# Patient Record
Sex: Male | Born: 1995
Health system: Southern US, Community
[De-identification: ages and names within clinical notes are randomized; demographics above are authoritative.]

## PROBLEM LIST (undated history)

## (undated) HISTORY — PX: HERNIA REPAIR: SHX51

---
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
## 2016-03-10 ENCOUNTER — Emergency Department
Admission: EM | Admit: 2016-03-10 | Discharge: 2016-03-10 | Disposition: A | Discharge status: Home / Self Care | Payer: Self-pay | Source: Home / Self Care

## 2016-03-10 NOTE — ED Notes (Signed)
Pt here for PPD reading. Right forearm negative read, no induration.

## 2017-09-10 ENCOUNTER — Encounter: Payer: Self-pay | Admitting: Internal Medicine

## 2017-09-10 ENCOUNTER — Ambulatory Visit: Payer: 59 | Admitting: Internal Medicine

## 2017-09-10 VITALS — BP 120/74 | HR 77 | Temp 98.1°F | Ht 69.0 in | Wt 145.0 lb

## 2017-09-10 DIAGNOSIS — K644 Residual hemorrhoidal skin tags: Secondary | ICD-10-CM | POA: Diagnosis not present

## 2017-09-10 DIAGNOSIS — K5900 Constipation, unspecified: Secondary | ICD-10-CM | POA: Diagnosis not present

## 2017-09-10 MED ORDER — HYDROCORTISONE 2.5 % RE CREA: 1.0000 "application " | TOPICAL_CREAM | Freq: Two times a day (BID) | RECTAL | 0 refills | Status: DC

## 2017-09-10 NOTE — Patient Instructions (Signed)

## 2017-09-10 NOTE — Progress Notes (Signed)
HPI  Pt presents to the clinic today to establish care. He is transferring care from [facility].  He is concerned about constipation. He reports he has been struggling with this for a few years. He typically has a BM every other day. Occasionally, he has to take Mirilax to help with a BM. He reports he has recently gotten an external hemorrhoid. It was a little painful but no itching. He has noticed a very small amount of bright red blood when he wipes. He has tried Preperation H with minimal relief.  Flu: never Tdap: 01/2008 Dentist: as needed  No past medical history on file.  No current outpatient medications on file.   No current facility-administered medications for this visit.     No Known Allergies  Family History  Problem Relation Age of Onset  . Hyperlipidemia Mother   . Hyperlipidemia Father   . Hypertension Father   . Cancer Maternal Grandmother   . Hearing loss Maternal Grandmother   . Diabetes Maternal Grandfather   . Heart disease Maternal Grandfather   . Ovarian cancer Paternal Grandmother   . Hyperlipidemia Paternal Grandmother   . Hypertension Paternal Grandmother   . Depression Paternal Grandmother   . Arthritis Paternal Grandfather   . Prostate cancer Paternal Grandfather   . COPD Paternal Grandfather   . Heart disease Paternal Grandfather   . Hyperlipidemia Paternal Grandfather   . Hypertension Paternal Grandfather   . Stroke Paternal Grandfather     Social History   Socioeconomic History  . Marital status: Single    Spouse name: Not on file  . Number of children: Not on file  . Years of education: Not on file  . Highest education level: Not on file  Social Needs  . Financial resource strain: Not on file  . Food insecurity - worry: Not on file  . Food insecurity - inability: Not on file  . Transportation needs - medical: Not on file  . Transportation needs - non-medical: Not on file  Occupational History  . Not on file  Tobacco Use  .  Smoking status: Never Smoker  . Smokeless tobacco: Never Used  Substance and Sexual Activity  . Alcohol use: No  . Drug use: No  . Sexual activity: Not on file  Other Topics Concern  . Not on file  Social History Narrative  . Not on file    ROS:  Constitutional: Denies fever, malaise, fatigue, headache or abrupt weight changes.  HEENT: Denies eye pain, eye redness, ear pain, ringing in the ears, wax buildup, runny nose, nasal congestion, bloody nose, or sore throat. Respiratory: Denies difficulty breathing, shortness of breath, cough or sputum production.   Cardiovascular: Denies chest pain, chest tightness, palpitations or swelling in the hands or feet.  Gastrointestinal: Pt reports constipation and external hemorrhoid. Denies abdominal pain, bloating, diarrhea or blood in the stool.  GU: Denies frequency, urgency, pain with urination, blood in urine, odor or discharge. Musculoskeletal: Denies decrease in range of motion, difficulty with gait, muscle pain or joint pain and swelling.  Skin: Denies redness, rashes, lesions or ulcercations.  Neurological: Denies dizziness, difficulty with memory, difficulty with speech or problems with balance and coordination.  Psych: Denies anxiety, depression, SI/HI.  No other specific complaints in a complete review of systems (except as listed in HPI above).  PE:  BP 120/74   Pulse 77   Temp 98.1 F (36.7 C) (Oral)   Ht 5\' 9"  (1.753 m)   Wt 145 lb (65.8  kg)   SpO2 98%   BMI 21.41 kg/m  Wt Readings from Last 3 Encounters:  09/10/17 145 lb (65.8 kg)  05/05/15 130 lb (59 kg) (16 %, Z= -0.98)*   * Growth percentiles are based on CDC (Boys, 2-20 Years) data.    General: Appears his stated age, well developed, well nourished in NAD. Cardiovascular: Normal rate and rhythm. S1,S2 noted.  No murmur, rubs or gallops noted.  Pulmonary/Chest: Normal effort and positive vesicular breath sounds. No respiratory distress. No wheezes, rales or ronchi  noted.  Abdomen: Soft and nontender. Normal bowel sounds. No distention or masses noted.  Rectal: Patient refused. Neurological: Alert and oriented.  Psychiatric: Mood and affect normal. Behavior is normal. Judgment and thought content normal.    BMET    Component Value Date/Time   NA 139 05/05/2015 1750   K 4.2 05/05/2015 1750   CL 102 05/05/2015 1750   CO2 28 05/05/2015 1750   GLUCOSE 87 05/05/2015 1750   BUN 14 05/05/2015 1750   CREATININE 1.03 05/05/2015 1750   CALCIUM 9.4 05/05/2015 1750   GFRNONAA >60 05/05/2015 1750   GFRAA >60 05/05/2015 1750    Lipid Panel  No results found for: CHOL, TRIG, HDL, CHOLHDL, VLDL, LDLCALC  CBC    Component Value Date/Time   WBC 9.2 05/05/2015 1750   RBC 4.81 05/05/2015 1750   HGB 13.2 05/05/2015 1750   HCT 40.6 05/05/2015 1750   PLT 277 05/05/2015 1750   MCV 84.4 05/05/2015 1750   MCH 27.4 05/05/2015 1750   MCHC 32.5 05/05/2015 1750   RDW 13.4 05/05/2015 1750   LYMPHSABS 2.0 05/05/2015 1750   MONOABS 0.9 05/05/2015 1750   EOSABS 0.3 05/05/2015 1750   BASOSABS 0.1 05/05/2015 1750    Hgb A1C No results found for: HGBA1C   Assessment and Plan:  Constipation:  Discussed high fiber diet Encouraged him to make sure that he is drinking at least 48 ounces of water daily Ok to take Mirilax daily if needed  External Hemorrhoid:  eRx for Proctosol BID until resolved Avoid straining  Return precautions discussed Nicki ReaperBAITY, REGINA, NP

## 2017-10-08 ENCOUNTER — Ambulatory Visit: Payer: Self-pay | Admitting: *Deleted

## 2017-10-08 ENCOUNTER — Ambulatory Visit: Payer: 59 | Admitting: Physician Assistant

## 2017-10-08 ENCOUNTER — Encounter: Payer: Self-pay | Admitting: Physician Assistant

## 2017-10-08 ENCOUNTER — Other Ambulatory Visit: Payer: Self-pay

## 2017-10-08 VITALS — BP 140/70 | HR 88 | Temp 98.6°F | Resp 14 | Ht 69.0 in | Wt 151.0 lb

## 2017-10-08 DIAGNOSIS — S0993XA Unspecified injury of face, initial encounter: Secondary | ICD-10-CM

## 2017-10-08 DIAGNOSIS — S060X0A Concussion without loss of consciousness, initial encounter: Secondary | ICD-10-CM | POA: Diagnosis not present

## 2017-10-08 NOTE — Progress Notes (Signed)
Patient presents to clinic today c/o facial injury and potential concussion occurring early yesterday afternoon.  Patient endorses snowboarding when he lost his balance and fell face first into the ground. Was wearing both a helmet and snow goggles at the time. Noted bruising on the bridge of his nose with tenderness. [facility]. Felt foggy for a few minutes after impact but was then feeling much better except for the tenderness and headache. Patient denies change in vision or hearing. Some mild mental fogginess today but is feeling better. Denies any fever, chills, nausea or vomiting. Is hydrating well. Has note some mild anorexia.  History reviewed. No pertinent past medical history.  No current outpatient medications on file prior to visit.   No current facility-administered medications on file prior to visit.     No Known Allergies  Family History  Problem Relation Age of Onset  . Hyperlipidemia Mother   . Hyperlipidemia Father   . Hypertension Father   . Cancer Maternal Grandmother   . Hearing loss Maternal Grandmother   . Diabetes Maternal Grandfather   . Heart disease Maternal Grandfather   . Ovarian cancer Paternal Grandmother   . Hyperlipidemia Paternal Grandmother   . Hypertension Paternal Grandmother   . Depression Paternal Grandmother   . Arthritis Paternal Grandfather   . Prostate cancer Paternal Grandfather   . COPD Paternal Grandfather   . Heart disease Paternal Grandfather   . Hyperlipidemia Paternal Grandfather   . Hypertension Paternal Grandfather   . Stroke Paternal Grandfather     Social History   Socioeconomic History  . Marital status: Single    Spouse name: None  . Number of children: None  . Years of education: None  . Highest education level: None  Social Needs  . Financial resource strain: None  . Food insecurity - worry: None  . Food insecurity - inability: None  . Transportation needs  - medical: None  . Transportation needs - non-medical: None  Occupational History  . None  Tobacco Use  . Smoking status: Never Smoker  . Smokeless tobacco: Never Used  Substance and Sexual Activity  . Alcohol use: No  . Drug use: No  . Sexual activity: None  Other Topics Concern  . None  Social History Narrative  . None   Review of Systems - See HPI.  All other ROS are negative.  BP 140/70   Pulse 88   Temp 98.6 F (37 C) (Oral)   Resp 14   Ht 5\' 9"  (1.753 m)   Wt 151 lb (68.5 kg)   SpO2 98%   BMI 22.30 kg/m   Physical Exam  Constitutional: He is oriented to person, place, and time and well-developed, well-nourished, and in no distress.  HENT:  Head: Normocephalic.  Right Ear: Tympanic membrane and external ear normal.  Left Ear: Tympanic membrane and external ear normal.  Nose: Sinus tenderness present. No nasal deformity. No epistaxis. Right sinus exhibits no maxillary sinus tenderness and no frontal sinus tenderness. Left sinus exhibits no maxillary sinus tenderness and no frontal sinus tenderness.  Mouth/Throat: Uvula is midline, oropharynx is clear and moist and mucous membranes are normal. No oropharyngeal exudate.    Eyes: Right eye visual fields normal and left eye visual fields normal. Conjunctivae and EOM are normal. Pupils are equal, round, and reactive to light.  Neck: Neck supple.  Cardiovascular: Normal rate, regular rhythm, normal heart sounds and intact distal pulses.  Pulmonary/Chest:  Effort normal and breath sounds normal. No respiratory distress. He has no wheezes. He has no rales. He exhibits no tenderness.  Neurological: He is alert and oriented to person, place, and time. He has intact cranial nerves. He displays facial symmetry. No cranial nerve deficit. Gait normal. Coordination normal.  Skin: Skin is warm and dry. No rash noted.  Vitals reviewed.  Assessment/Plan: 1. Concussion without loss of consciousness, initial encounter Mild concussion.  Neuro examination within normal limits. Mild headache today. Discussed supportive measures and OTC medications. Reassurance given.   2. Facial trauma, initial encounter Mild nasal contusion. Cannot r/o small anterior fracture but no bony deformity noted. Declines imaging today. OTC medications reviewed.   Strict return precautions reviewed. Alarm signs/symptoms reviewed with patient that would warrant ER assessment.   Piedad Climes, PA-C

## 2017-10-08 NOTE — Patient Instructions (Signed)
Please stay well-hydrated and get plenty of rest.  Keep a well-balanced diet.  Tylenol or Ibuprofen if needed for headache.  It is common to get a headache for up to 3-5 days after a concussion. It is very fortunate that you were wearing a helmet and goggles or this would have been much worse.   If you note any nausea or vomiting, any change in mentation or acute worsening of headache, please be seen ASAP at ER. I do not expect this but want to review with you.   Take it easy for a few days.    Concussion, Adult A concussion is a brain injury from a direct hit (blow) to the head or body. This injury causes the brain to shake quickly back and forth inside the skull. It is caused by:  A hit to the head.  A quick and sudden movement (jolt) of the head or neck.  How fast you will get better from a concussion depends on many things like how bad your concussion was, what part of your brain was hurt, how old you are, and how healthy you were before the concussion. Recovery can take time. It is important to wait to return to activity until a doctor says it is safe and your symptoms are all gone. Follow these instructions at home: Activity  Limit activities that need a lot of thought or concentration. These include: ? Homework or work for your job. ? Watching TV. ? Computer work. ? Playing memory games and puzzles.  Rest. Rest helps the brain to heal. Make sure you: ? Get plenty of sleep at night. Do not stay up late. ? Go to bed at the same time every day. ? Rest during the day. Take naps or rest breaks when you feel tired.  It can be dangerous if you get another concussion before the first one has healed Do not do activities that could cause a second concussion, such as riding a bike or playing sports.  Ask your doctor when you can return to your normal activities, like driving, riding a bike, or using machinery. Your ability to react may be slower. Do not do these activities if you are  dizzy. Your doctor will likely give you a plan for slowly going back to activities. General instructions  Take over-the-counter and prescription medicines only as told by your doctor.  Do not drink alcohol until your doctor says you can.  If it is harder than usual to remember things, write them down.  If you are easily distracted, try to do one thing at a time. For example, do not try to watch TV while making dinner.  Talk with family members or close friends when you need to make important decisions.  Watch your symptoms and tell other people to do the same. Other problems (complications) can happen after a concussion. Older adults with a brain injury may have a higher risk of serious problems, such as a blood clot in the brain.  Tell your teachers, school nurse, school counselor, coach, Event organiser, or work Production designer, theatre/television/film about your injury and symptoms. Tell them about what you can or cannot do. They should watch for: ? More problems with attention or concentration. ? More trouble remembering or learning new information. ? More time needed to do tasks or assignments. ? Being more annoyed (irritable) or having a harder time dealing with stress. ? Any other symptoms that get worse.  Keep all follow-up visits as told by your health care provider.  This is important. Prevention  It is very important that you donot get another brain injury, especially before you have healed. In rare cases, another injury can cause permanent brain damage, brain swelling, or death. You have the most risk if you get another head injury in the first 7-10 days after you were hurt before. To avoid injuries: ? Wear a seat belt when you ride in a car. ? Do not drink too much alcohol. ? Avoid activities that could make you get a second concussion, like contact sports. ? Wear a helmet when you do activities like:  Biking.  Skiing.  Skateboarding.  Skating. ? Make your home safe by:  Removing things from the  floor or stairs that could make you trip.  Using grab bars in bathrooms and handrails by stairs.  Placing non-slip mats on floors and in bathtubs.  Putting more light in dark areas. Contact a doctor if:  Your symptoms get worse.  You have new symptoms.  You keep having symptoms for more than 2 weeks. Get help right away if:  You have bad headaches, or your headaches get worse.  You have weakness in any part of your body.  You have loss of feeling (numbness).  You feel off balance.  You keep throwing up (vomiting).  You feel more sleepy.  The black center of one eye (pupil) is bigger than the other one.  You twitch or shake violently (convulse) or have a seizure.  Your speech is not clear (is slurred).  You feel more tired, more confused, or more annoyed.  You do not recognize people or places.  You have neck pain.  It is hard to wake you up.  You have strange behavior changes.  You pass out (lose consciousness). Summary  A concussion is a brain injury from a direct hit (blow) to the head or body.  This condition is treated with rest and careful watching of symptoms.  If you keep having symptoms for more than 2 weeks, call your doctor. This information is not intended to replace advice given to you by your health care provider. Make sure you discuss any questions you have with your health care provider. Document Released: 08/08/2009 Document Revised: 08/04/2016 Document Reviewed: 08/04/2016 [employer] Interactive Patient Education  2017 [employer]

## 2017-10-08 NOTE — Telephone Encounter (Addendum)
Pt states he fell off his snowboard yesterday and hit his forehead and nose. He was wearing a helmet and goggles. He had blurred vision at the time of the accident but not today. Today he has a headache and requesting to be checked out. He has swelling around the bridge of his nose. He has an appointment scheduled with Enid Skeens. Martin, PA in [place].    Reason for Disposition . [1] Last tetanus shot > 5 years ago AND [2] DIRTY cut or scrape  Answer Assessment - Initial Assessment Questions 1. MECHANISM: "How did the injury happen?"      Snow boarding 2. ONSET: "When did the injury happen?" (Minutes or hours ago)     yesterday 3. LOCATION: "What part of the face is injured?"     Forehead and bridge of nose 4. APPEARANCE of INJURY: "What does the face look like?"     Swelling around the bridge of nose, top lip busted 5. BLEEDING: "Is it bleeding now?" If so, ask, "Is it difficult to stop?"     no 6. PAIN: "Is there pain?" If so, ask: "How bad is the pain?"  (e.g., Scale 1-10; or mild, moderate, severe)     # 4 head 7. SIZE: For cuts, bruises, or swelling, ask: "How large is it?" (e.g., inches or centimeters)      Slightly swollen 8. TETANUS: For any breaks in the skin, ask: "When was the last tetanus booster?"     No sure 9. OTHER SYMPTOMS: "Do you have any other symptoms?" (e.g., neck pain, headache, loss of consciousness)     headache 10. PREGNANCY: "Is there any chance you are pregnant?" "When was your last menstrual period?"       n/a  Protocols used: FACE INJURY-A-AH

## 2017-11-27 ENCOUNTER — Encounter: Payer: Self-pay | Admitting: Primary Care

## 2017-11-27 ENCOUNTER — Ambulatory Visit: Payer: 59 | Admitting: Primary Care

## 2017-11-27 ENCOUNTER — Ambulatory Visit (INDEPENDENT_AMBULATORY_CARE_PROVIDER_SITE_OTHER)
Admission: RE | Admit: 2017-11-27 | Discharge: 2017-11-27 | Disposition: A | Discharge status: Home / Self Care | Payer: 59 | Source: Ambulatory Visit | Attending: Primary Care | Admitting: Primary Care

## 2017-11-27 VITALS — BP 122/70 | HR 67 | Temp 98.3°F | Ht 69.0 in | Wt 147.8 lb

## 2017-11-27 DIAGNOSIS — M25562 Pain in left knee: Secondary | ICD-10-CM

## 2017-11-27 NOTE — Progress Notes (Signed)
Subjective:    Patient ID: Kirk Davis, male    DOB: 1996/08/25, 22 y.o.   MRN: [id number]  HPI  Mr. Vanoverbeke is a 22 year old male who presents today with a chief complaint of knee pain.  His pain is located to the left lateral knee which has been present for the past one month. He was working out at [employer] during that time and heard a "poppping" noise. His pain improved, never dissipated completely, after that incident one month ago. His pain returned one week ago while working out at the gym doing squats. He heard the popping sound which caused significant pain.   Since his most recent incident he's continued to notice pain, also noticing slight weakness walking up and down stairs. He's noticed that he's altering his gait slightly.  He's also noticed slight swelling to the site of pain. He's been wearing a knee brace, taking Tylenol/Ibuprofen with little improvement.   He denies trauma, changes in skin color, numbness.  Review of Systems  Musculoskeletal:       Left lateral knee pain  Skin: Negative for color change.  Neurological: Negative for numbness.       No past medical history on file.   Social History   Socioeconomic History  . Marital status: Single    Spouse name: Not on file  . Number of children: Not on file  . Years of education: Not on file  . Highest education level: Not on file  Occupational History  . Not on file  Social Needs  . Financial resource strain: Not on file  . Food insecurity:    Worry: Not on file    Inability: Not on file  . Transportation needs:    Medical: Not on file    Non-medical: Not on file  Tobacco Use  . Smoking status: Never Smoker  . Smokeless tobacco: Never Used  Substance and Sexual Activity  . Alcohol use: No  . Drug use: No  . Sexual activity: Not on file  Lifestyle  . Physical activity:    Days per week: Not on file    Minutes per session: Not on file  . Stress: Not on file  Relationships  . Social  connections:    Talks on phone: Not on file    Gets together: Not on file    Attends religious service: Not on file    Active member of club or organization: Not on file    Attends meetings of clubs or organizations: Not on file    Relationship status: Not on file  . Intimate partner violence:    Fear of current or ex partner: Not on file    Emotionally abused: Not on file    Physically abused: Not on file    Forced sexual activity: Not on file  Other Topics Concern  . Not on file  Social History Narrative  . Not on file    Past Surgical History:  Procedure Laterality Date  . HERNIA REPAIR      Family History  Problem Relation Age of Onset  . Hyperlipidemia Mother   . Hyperlipidemia Father   . Hypertension Father   . Cancer Maternal Grandmother   . Hearing loss Maternal Grandmother   . Diabetes Maternal Grandfather   . Heart disease Maternal Grandfather   . Ovarian cancer Paternal Grandmother   . Hyperlipidemia Paternal Grandmother   . Hypertension Paternal Grandmother   . Depression Paternal Grandmother   . Arthritis  Paternal Grandfather   . Prostate cancer Paternal Grandfather   . COPD Paternal Grandfather   . Heart disease Paternal Grandfather   . Hyperlipidemia Paternal Grandfather   . Hypertension Paternal Grandfather   . Stroke Paternal Grandfather     No Known Allergies  No current outpatient medications on file prior to visit.   No current facility-administered medications on file prior to visit.     BP 122/70   Pulse 67   Temp 98.3 F (36.8 C) (Oral)   Ht 5\' 9"  (1.753 m)   Wt 147 lb 12 oz (67 kg)   SpO2 98%   BMI 21.82 kg/m    Objective:   Physical Exam  Constitutional: He appears well-nourished.  Neck: Neck supple.  Cardiovascular: Normal rate.  Pulmonary/Chest: Effort normal.  Musculoskeletal:       Legs: Pain upon palpation to left lateral knee along lateral co-lateral ligament. Pain with movement of co-lateral ligament in supine  position with bent knee.  Skin: Skin is warm and dry.          Assessment & Plan:  Acute Knee Pain:  Located to left lateral knee x 1 week, little improvement with conservative measures. Exam today suspicious for left lateral co-lateral injury/tear. He is ambulatory and stable on exam.  Will have him wear knee brace when ambulatory. Ice, rest, elevate. Discussed Ibuprofen/Tylenol use. Plain films pending. He will see Sports Medicine next week.   Doreene NestKatherine K Torrance Frech, NP

## 2017-11-27 NOTE — Progress Notes (Signed)
Subjective:    Patient ID: Kirk Davis, male    DOB: 02-Aug-1996, 22 y.o.   MRN: [id number]  HPI Kirk Davis is a 22 y.o. male who presents today with CC of L Knee Pain. Has been going on for a month after hearing a popping sound while working out but has worsened over the last week after working out again after hearing a popping sound again. States pain is a 7/10 and is worse when going up stairs. Has tried an knee brace, APAP 650 daily and Ibuprofen 400 BID with little relief. Denies any trauma but has swelling on L Lateral knee that doesn't seem to go away.   Review of Systems  Constitutional: Negative for activity change and fatigue.  Musculoskeletal: Negative for gait problem.  Neurological: Negative for weakness and numbness.      No past medical history on file. Past Surgical History:  Procedure Laterality Date  . HERNIA REPAIR     Social History   Socioeconomic History  . Marital status: Single    Spouse name: Not on file  . Number of children: Not on file  . Years of education: Not on file  . Highest education level: Not on file  Occupational History  . Not on file  Social Needs  . Financial resource strain: Not on file  . Food insecurity:    Worry: Not on file    Inability: Not on file  . Transportation needs:    Medical: Not on file    Non-medical: Not on file  Tobacco Use  . Smoking status: Never Smoker  . Smokeless tobacco: Never Used  Substance and Sexual Activity  . Alcohol use: No  . Drug use: No  . Sexual activity: Not on file  Lifestyle  . Physical activity:    Days per week: Not on file    Minutes per session: Not on file  . Stress: Not on file  Relationships  . Social connections:    Talks on phone: Not on file    Gets together: Not on file    Attends religious service: Not on file    Active member of club or organization: Not on file    Attends meetings of clubs or organizations: Not on file    Relationship status: Not on file  .  Intimate partner violence:    Fear of current or ex partner: Not on file    Emotionally abused: Not on file    Physically abused: Not on file    Forced sexual activity: Not on file  Other Topics Concern  . Not on file  Social History Narrative  . Not on file   Family History  Problem Relation Age of Onset  . Hyperlipidemia Mother   . Hyperlipidemia Father   . Hypertension Father   . Cancer Maternal Grandmother   . Hearing loss Maternal Grandmother   . Diabetes Maternal Grandfather   . Heart disease Maternal Grandfather   . Ovarian cancer Paternal Grandmother   . Hyperlipidemia Paternal Grandmother   . Hypertension Paternal Grandmother   . Depression Paternal Grandmother   . Arthritis Paternal Grandfather   . Prostate cancer Paternal Grandfather   . COPD Paternal Grandfather   . Heart disease Paternal Grandfather   . Hyperlipidemia Paternal Grandfather   . Hypertension Paternal Grandfather   . Stroke Paternal Grandfather      Objective:   Physical Exam  Constitutional: He appears well-nourished.  Musculoskeletal: Normal range of motion.  Left knee: He exhibits swelling. He exhibits no erythema and no bony tenderness. Tenderness found.  Neurological: He has normal strength. A sensory deficit is present.    BP 122/70   Pulse 67   Temp 98.3 F (36.8 C) (Oral)   Ht 5\' 9"  (1.753 m)   Wt 147 lb 12 oz (67 kg)   SpO2 98%   BMI 21.82 kg/m       Assessment & Plan:   1. Acute pain of left knee - Knee brace with rest, elevation, and ice - DG Knee Complete 4 Views Left  Rebecka ApleyJoshua M Veron Senner, RN, Adult-Geriatric Nurse Practitioner Student

## 2017-11-27 NOTE — Patient Instructions (Addendum)
Please where your home knee brace. Make sure to rest, ice, and elevate your leg at all possible. Light work duty. Tylenol or Ibuprofen per manufacturer's recommendation as needed for pain.   Follow up with Dr. Copeland in a week.   IDallas Schimket has been a pleasure seeing you today. Rebecka ApleyJoshua M Kaelob Persky, RN, Adult-Geriatric Nurse Practitioner Student and Mayra ReelKate Clark, AGNP

## 2017-12-04 ENCOUNTER — Ambulatory Visit: Payer: 59 | Admitting: Family Medicine

## 2017-12-04 ENCOUNTER — Other Ambulatory Visit: Payer: Self-pay

## 2017-12-04 ENCOUNTER — Encounter: Payer: Self-pay | Admitting: Family Medicine

## 2017-12-04 VITALS — BP 130/60 | HR 67 | Temp 98.4°F | Ht 69.0 in | Wt 148.2 lb

## 2017-12-04 DIAGNOSIS — M25562 Pain in left knee: Secondary | ICD-10-CM | POA: Diagnosis not present

## 2017-12-04 NOTE — Progress Notes (Signed)
Dr. Karleen Hampshire T. Lianna Sitzmann, MD, CAQ Sports Medicine Primary Care and Sports Medicine [address]  [place], [address] Phone: [phone] Fax: [phone]  12/04/2017  Patient: Kirk Davis, MRN: [id number], DOB: [birth year], 22 y.o.  Primary Physician:  Lorre Munroe, NP   Chief Complaint  Patient presents with  . Knee Pain    Left-Hurt while Air Squatting x 2 weeks   Subjective:   Kirk Davis is a 23 y.o. very pleasant male patient who presents with the following:  Referring provider: Mrs. Chestine Spore PCP: Mrs. 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  At that point he had some acute pain.  He also had a similar mechanical symptoms approximately 4 weeks or so prior to this, it was in the same side, laterally of the left knee.  He has not had any significant effusion.  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  He has been able to complete all the PT tasks that he has been given.  He has adapted his workouts overall to try to limit impact on the knee.  Doing some squats and felt a pop the next day. 6 weeks ago something similar happened - a catching.   Past Medical History, Surgical History, Social History, Family History, Problem List, Medications, and Allergies have been reviewed and updated if relevant.  There are no active problems to display for this patient.   History reviewed. No pertinent past medical history.  Past Surgical History:  Procedure Laterality Date  . HERNIA REPAIR      Social History   Socioeconomic History  . Marital status: Single    Spouse name: Not on file  . Number of children: Not on file  . Years of education: Not on file  . Highest education level: Not on file  Occupational History  . Not on file  Social Needs  . Financial resource strain: Not on file  . Food insecurity:    Worry: Not  on file    Inability: Not on file  . Transportation needs:    Medical: Not on file    Non-medical: Not on file  Tobacco Use  . Smoking status: Never Smoker  . Smokeless tobacco: Never Used  Substance and Sexual Activity  . Alcohol use: No  . Drug use: No  . Sexual activity: Not on file  Lifestyle  . Physical activity:    Days per week: Not on file    Minutes per session: Not on file  . Stress: Not on file  Relationships  . Social connections:    Talks on phone: Not on file    Gets together: Not on file    Attends religious service: Not on file    Active member of club or organization: Not on file    Attends meetings of clubs or organizations: Not on file    Relationship status: Not on file  . Intimate partner violence:    Fear of current or ex partner: Not on file    Emotionally abused: Not on file    Physically abused: Not on file    Forced sexual activity: Not on file  Other Topics Concern  . Not on file  Social History Narrative  . Not on file    Family History  Problem Relation Age of Onset  . Hyperlipidemia Mother   .  Hyperlipidemia Father   . Hypertension Father   . Cancer Maternal Grandmother   . Hearing loss Maternal Grandmother   . Diabetes Maternal Grandfather   . Heart disease Maternal Grandfather   . Ovarian cancer Paternal Grandmother   . Hyperlipidemia Paternal Grandmother   . Hypertension Paternal Grandmother   . Depression Paternal Grandmother   . Arthritis Paternal Grandfather   . Prostate cancer Paternal Grandfather   . COPD Paternal Grandfather   . Heart disease Paternal Grandfather   . Hyperlipidemia Paternal Grandfather   . Hypertension Paternal Grandfather   . Stroke Paternal Grandfather     No Known Allergies  Medication list reviewed and updated in full in [facility].  GEN: No fevers, chills. Nontoxic. Primarily MSK c/o today. MSK: Detailed in the HPI GI: tolerating PO intake without difficulty Neuro: No numbness,  parasthesias, or tingling associated. Otherwise the pertinent positives of the ROS are noted above.   Objective:   BP 130/60   Pulse 67   Temp 98.4 F (36.9 C) (Oral)   Ht 5\' 9"  (1.753 m)   Wt 148 lb 4 oz (67.2 kg)   BMI 21.89 kg/m    GEN: WDWN, NAD, Non-toxic, Alert & Oriented x 3 HEENT: Atraumatic, Normocephalic.  Ears and Nose: No external deformity. EXTR: No clubbing/cyanosis/edema NEURO: Normal gait.  PSYCH: Normally interactive. Conversant. Not depressed or anxious appearing.  Calm demeanor.    Full extension, hyperextends to approximately 2 degrees.  Flexion to 125 degrees.  On the right knee, there is flexion to 135 degrees.  The right knee has no effusion, is stable to varus and valgus stress, intact ACL and PCL.  Bounce home, flex pinch, and McMurray's all cause no pain.  Left knee: There is no significant tenderness with loading the medial or lateral patellar facets.  There is no significant patellar grinding.  Medial joint line is completely nontender.  Patient does have some tenderness along the lateral joint line.  This seems to be the point of maximal pain.  MCL is clearly felt and is intact.  LCL is clearly felt on exam with varus stress and is intact and causes no pain with stress at multiple angles.  Lachman is negative.  Posterior drawer testing is negative.  Patient does have pain with McMurray's testing without mechanical symptoms.  Flexion pinch causes mild pain.  Bounce home test is negative.  Apley compression / distraction test: On the left side the patient has no pain with distraction test, and he has notable worsened pain with compression test.  On the right, both tests are nontender.  Radiology: Dg Knee 4 Views W/patella Left  Result Date: 11/27/2017 CLINICAL DATA:  Lateral left knee pain, no known injury, initial encounter EXAM: LEFT KNEE - COMPLETE 4+ VIEW COMPARISON:  None. FINDINGS: No acute fracture or dislocation is noted. Thickening is noted along  the lateral aspect of the left knee which may be related to the given clinical history of lateral collateral ligament injury. Some increased density is noted in the infrapatellar fat pad which may represent some early inflammatory change. IMPRESSION: Soft tissue swelling noted laterally consistent with the given clinical history of lateral collateral ligament injury. No acute bony abnormality is noted. Electronically Signed   By: Alcide CleverMark  Lukens M.D.   On: 11/27/2017 19:23    Assessment and Plan:   Lateral knee pain, left  Most likely internal derangement, left lateral meniscal tear.  Cannot exclude grade 1 LCL sprain previously, healed at this point.  Mechanical symptoms, pain on the joint line, pain with Apley's compression test but none with Apley's distraction test, pain with McMurray's test suggests meniscal pathology.  We discussed options.  For now he would like to try conservative measures and see if this will heal on its own.  He should be safe to continue working out, avoiding things that cause pain in the knee, wearing his brace and wear a brace when working.  Close follow-up.  If symptoms persist, consideration for surgical repair is appropriate at age 40.  I appreciate the opportunity to evaluate this very friendly patient. If you have any question regarding his care or prognosis, do not hesitate to ask.   Follow-up: Return in about 1 month (around 01/01/2018).   Signed,  Elpidio Galea. Berlyn Saylor, MD   Allergies as of 12/04/2017   No Known Allergies     Medication List    as of 12/04/2017 11:59 PM   You have not been prescribed any medications.

## 2018-01-05 NOTE — Progress Notes (Signed)
Dr. Karleen Hampshire T. Livio Ledwith, MD, CAQ Sports Medicine Primary Care and Sports Medicine [address] [place], [address] Phone: [phone] Fax: [phone]  01/06/2018  Patient: Kirk Davis, MRN: [id number], DOB: 1996-04-19, 22 y.o.  Primary Physician:  Lorre Munroe, NP   Chief Complaint  Patient presents with  . Follow-up    Left knee   Subjective:   Kirk Davis is a 22 y.o. very pleasant male patient who presents with the following:  Very nice young man who I saw approximately 1 month ago, and at that point he sustained a lateral knee injury.  At that point I thought he most likely had a lateral meniscus injury, and we ultimately decided to opt for some conservative treatment of this and see how he does.  He is here today in follow-up for reassessment and reexamination.  Gotten a lot better, still bothering him some - not 100, but 80-90% better.  Played 13 holes of golf last week. 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  Past Medical History, Surgical History, Social History, Family History, Problem List, Medications, and Allergies have been reviewed and updated if relevant.  There are no active problems to display for this patient.   History reviewed. No pertinent past medical history.  Past Surgical History:  Procedure Laterality Date  . HERNIA REPAIR      Social History   Socioeconomic History  . Marital status: Single    Spouse name: Not on file  . Number of children: Not on file  . Years of education: Not on file  . Highest education level: Not on file  Occupational History  . Not on file  Social Needs  . Financial resource strain: Not on file  . Food insecurity:    Worry: Not on file    Inability: Not on file  . Transportation needs:    Medical: Not on file    Non-medical: Not on file  Tobacco Use  . Smoking status: Never Smoker  . Smokeless tobacco: Never Used  Substance and Sexual Activity  . Alcohol use: No  . Drug use:  No  . Sexual activity: Not on file  Lifestyle  . Physical activity:    Days per week: Not on file    Minutes per session: Not on file  . Stress: Not on file  Relationships  . Social connections:    Talks on phone: Not on file    Gets together: Not on file    Attends religious service: Not on file    Active member of club or organization: Not on file    Attends meetings of clubs or organizations: Not on file    Relationship status: Not on file  . Intimate partner violence:    Fear of current or ex partner: Not on file    Emotionally abused: Not on file    Physically abused: Not on file    Forced sexual activity: Not on file  Other Topics Concern  . Not on file  Social History Narrative  . Not on file    Family History  Problem Relation Age of Onset  . Hyperlipidemia Mother   . Hyperlipidemia Father   . Hypertension Father   . Cancer Maternal Grandmother   . Hearing loss Maternal Grandmother   . Diabetes Maternal Grandfather   . Heart disease Maternal Grandfather   . Ovarian cancer Paternal Grandmother   . Hyperlipidemia Paternal Grandmother   . Hypertension Paternal  Grandmother   . Depression Paternal Grandmother   . Arthritis Paternal Grandfather   . Prostate cancer Paternal Grandfather   . COPD Paternal Grandfather   . Heart disease Paternal Grandfather   . Hyperlipidemia Paternal Grandfather   . Hypertension Paternal Grandfather   . Stroke Paternal Grandfather     No Known Allergies  Medication list reviewed and updated in full in [facility].  GEN: No fevers, chills. Nontoxic. Primarily MSK c/o today. MSK: Detailed in the HPI GI: tolerating PO intake without difficulty Neuro: No numbness, parasthesias, or tingling associated. Otherwise the pertinent positives of the ROS are noted above.   Objective:   BP 120/64   Pulse 76   Temp 98.3 F (36.8 C) (Oral)   Ht  (1.753 m)   Wt 149 lb 8 oz (67.8 kg)   BMI 22.08 kg/m    GEN: WDWN, NAD,  Non-toxic, Alert & Oriented x 3 HEENT: Atraumatic, Normocephalic.  Ears and Nose: No external deformity. EXTR: No clubbing/cyanosis/edema NEURO: Normal gait.  PSYCH: Normally interactive. Conversant. Not depressed or anxious appearing.  Calm demeanor.   Knee:  L Gait: Normal heel toe pattern ROM: 0-135 Effusion: neg Echymosis or edema: none Patellar tendon NT Painful PLICA: neg Patellar grind: negative Medial and lateral patellar facet loading: negative medial and lateral joint lines:NT Mcmurray's + for pain apley maneuver + with compression, much improved Flexion-pinch neg Varus and valgus stress: stable Lachman: neg Ant and Post drawer: neg Hip abduction, IR, ER: WNL Hip flexion str: 5/5 Hip abd: 5/5 Quad: 5/5 VMO atrophy:No Hamstring concentric and eccentric: 5/5   Radiology: Dg Knee 4 Views W/patella Left  Result Date: 11/27/2017 CLINICAL DATA:  Lateral left knee pain, no known injury, initial encounter EXAM: LEFT KNEE - COMPLETE 4+ VIEW COMPARISON:  None. FINDINGS: No acute fracture or dislocation is noted. Thickening is noted along the lateral aspect of the left knee which may be related to the given clinical history of lateral collateral ligament injury. Some increased density is noted in the infrapatellar fat pad which may represent some early inflammatory change. IMPRESSION: Soft tissue swelling noted laterally consistent with the given clinical history of lateral collateral ligament injury. No acute bony abnormality is noted. Electronically Signed   By: Alcide Clever M.D.   On: 11/27/2017 19:23   Assessment and Plan:   Lateral knee pain, left  Acute pain of left knee  Doing much better. Mild pain with lateral knee load. Ligaments are all stable.   Increase pace of exertion, strengthening, and see how things go next 1-2 mo.  Follow-up: If still sx at 7/4, he is to follow-up  Signed,  Abdulkareem Badolato T. Yee Gangi, MD   Allergies as of 01/06/2018   No Known Allergies       Medication List    as of 01/06/2018  1:53 PM   You have not been prescribed any medications.

## 2018-01-06 ENCOUNTER — Encounter: Payer: Self-pay | Admitting: Family Medicine

## 2018-01-06 ENCOUNTER — Other Ambulatory Visit: Payer: Self-pay

## 2018-01-06 ENCOUNTER — Ambulatory Visit: Payer: 59 | Admitting: Family Medicine

## 2018-01-06 VITALS — BP 120/64 | HR 76 | Temp 98.3°F | Ht 69.0 in | Wt 149.5 lb

## 2018-01-06 DIAGNOSIS — M25562 Pain in left knee: Secondary | ICD-10-CM

## 2018-11-05 ENCOUNTER — Encounter: Payer: Self-pay | Admitting: Internal Medicine

## 2018-11-05 ENCOUNTER — Ambulatory Visit (INDEPENDENT_AMBULATORY_CARE_PROVIDER_SITE_OTHER): Payer: 59 | Admitting: Internal Medicine

## 2018-11-05 VITALS — BP 122/76 | HR 82 | Temp 98.1°F | Wt 150.0 lb

## 2018-11-05 DIAGNOSIS — R21 Rash and other nonspecific skin eruption: Secondary | ICD-10-CM

## 2018-11-05 NOTE — Progress Notes (Signed)
Subjective:    Patient ID: Kirk Davis, male    DOB: July 14, 1996, 23 y.o.   MRN: [id number]  HPI  Pt presents to the clinic today with c/o a rash on his left lower arm and bilateral upper thighs. He noticed this 4 days ago. The rash is itchy. It does not burn. He has not come in contact with anything that he is allergic to but he has been working on an old house. He denies changes in diet or medications. He denies changes in soaps, lotions or detergents. No one in his home has a similar rash. He has tried Hydrocortisone OTC with great improvement.  Review of Systems      No past medical history on file.  No current outpatient medications on file.   No current facility-administered medications for this visit.     No Known Allergies  Family History  Problem Relation Age of Onset  . Hyperlipidemia Mother   . Hyperlipidemia Father   . Hypertension Father   . Cancer Maternal Grandmother   . Hearing loss Maternal Grandmother   . Diabetes Maternal Grandfather   . Heart disease Maternal Grandfather   . Ovarian cancer Paternal Grandmother   . Hyperlipidemia Paternal Grandmother   . Hypertension Paternal Grandmother   . Depression Paternal Grandmother   . Arthritis Paternal Grandfather   . Prostate cancer Paternal Grandfather   . COPD Paternal Grandfather   . Heart disease Paternal Grandfather   . Hyperlipidemia Paternal Grandfather   . Hypertension Paternal Grandfather   . Stroke Paternal Grandfather     Social History   Socioeconomic History  . Marital status: Single    Spouse name: Not on file  . Number of children: Not on file  . Years of education: Not on file  . Highest education level: Not on file  Occupational History  . Not on file  Social Needs  . Financial resource strain: Not on file  . Food insecurity:    Worry: Not on file    Inability: Not on file  . Transportation needs:    Medical: Not on file    Non-medical: Not on file  Tobacco Use  .  Smoking status: Never Smoker  . Smokeless tobacco: Never Used  Substance and Sexual Activity  . Alcohol use: No  . Drug use: No  . Sexual activity: Not on file  Lifestyle  . Physical activity:    Days per week: Not on file    Minutes per session: Not on file  . Stress: Not on file  Relationships  . Social connections:    Talks on phone: Not on file    Gets together: Not on file    Attends religious service: Not on file    Active member of club or organization: Not on file    Attends meetings of clubs or organizations: Not on file    Relationship status: Not on file  . Intimate partner violence:    Fear of current or ex partner: Not on file    Emotionally abused: Not on file    Physically abused: Not on file    Forced sexual activity: Not on file  Other Topics Concern  . Not on file  Social History Narrative  . Not on file     Constitutional: Denies fever, malaise, fatigue, headache or abrupt weight changes.  Respiratory: Denies difficulty breathing, shortness of breath, cough or sputum production.   Cardiovascular: Denies chest pain, chest tightness, palpitations or swelling in  the hands or feet.  Skin: Pt reports rash. Denies redness, lesions or ulcercations.   No other specific complaints in a complete review of systems (except as listed in HPI above).  Objective:   Physical Exam   BP 122/76   Pulse 82   Temp 98.1 F (36.7 C) (Oral)   Wt 150 lb (68 kg)   SpO2 98%   BMI 22.15 kg/m  Wt Readings from Last 3 Encounters:  11/05/18 150 lb (68 kg)  01/06/18 149 lb 8 oz (67.8 kg)  12/04/17 148 lb 4 oz (67.2 kg)    General: Appears his stated age, well developed, well nourished in NAD. Skin: Warm, dry and intact. Barely visible maculopapular rash noted of left forearm and bilateral groins. Cardiovascular: Normal rate and rhythm. S1,S2 noted.  No murmur, rubs or gallops noted.  Pulmonary/Chest: Normal effort and positive vesicular breath sounds. No respiratory  distress. No wheezes, rales or ronchi noted.  Neurological: Alert and oriented.    BMET    Component Value Date/Time   NA 139 05/05/2015 1750   K 4.2 05/05/2015 1750   CL 102 05/05/2015 1750   CO2 28 05/05/2015 1750   GLUCOSE 87 05/05/2015 1750   BUN 14 05/05/2015 1750   CREATININE 1.03 05/05/2015 1750   CALCIUM 9.4 05/05/2015 1750   GFRNONAA >60 05/05/2015 1750   GFRAA >60 05/05/2015 1750    Lipid Panel  No results found for: CHOL, TRIG, HDL, CHOLHDL, VLDL, LDLCALC  CBC    Component Value Date/Time   WBC 9.2 05/05/2015 1750   RBC 4.81 05/05/2015 1750   HGB 13.2 05/05/2015 1750   HCT 40.6 05/05/2015 1750   PLT 277 05/05/2015 1750   MCV 84.4 05/05/2015 1750   MCH 27.4 05/05/2015 1750   MCHC 32.5 05/05/2015 1750   RDW 13.4 05/05/2015 1750   LYMPHSABS 2.0 05/05/2015 1750   MONOABS 0.9 05/05/2015 1750   EOSABS 0.3 05/05/2015 1750   BASOSABS 0.1 05/05/2015 1750    Hgb A1C No results found for: HGBA1C         Assessment & Plan:   Rash:  Clinically improving Continue Hydrocortisone OTC Can take Zyrtec or Benadryl as well He declines 80 mg Depo IM or RX for steroid cream at this time  Return precautions discussed Nicki Reaper, NP

## 2018-11-05 NOTE — Patient Instructions (Signed)
Rash, Adult    A rash is a change in the color of your skin. A rash can also change the way your skin feels. There are many different conditions and factors that can cause a rash.  Follow these instructions at home:  The goal of treatment is to stop the itching and keep the rash from spreading. Watch for any changes in your symptoms. Let your doctor know about them. Follow these instructions to help with your condition:  Medicine  Take or apply over-the-counter and prescription medicines only as told by your doctor. These may include medicines:   To treat red or swollen skin (corticosteroid creams).   To treat itching.   To treat an allergy (oral antihistamines).   To treat very bad symptoms (oral corticosteroids).    Skin care   Put cool cloths (compresses) on the affected areas.   Do not scratch or rub your skin.   Avoid covering the rash. Make sure that the rash is exposed to air as much as possible.  Managing itching and discomfort   Avoid hot showers or baths. These can make itching worse. A cold shower may help.   Try taking a bath with:  ? Epsom salts. You can get these at your local pharmacy or grocery store. Follow the instructions on the package.  ? Baking soda. Pour a small amount into the bath as told by your doctor.  ? Colloidal oatmeal. You can get this at your local pharmacy or grocery store. Follow the instructions on the package.   Try putting baking soda paste onto your skin. Stir water into baking soda until it gets like a paste.   Try putting on a lotion that relieves itchiness (calamine lotion).   Keep cool and out of the sun. Sweating and being hot can make itching worse.  General instructions     Rest as needed.   Drink enough fluid to keep your pee (urine) pale yellow.   Wear loose-fitting clothing.   Avoid scented soaps, detergents, and perfumes. Use gentle soaps, detergents, perfumes, and other cosmetic products.   Avoid anything that causes your rash. Keep a journal to  help track what causes your rash. Write down:  ? What you eat.  ? What cosmetic products you use.  ? What you drink.  ? What you wear. This includes jewelry.   Keep all follow-up visits as told by your doctor. This is important.  Contact a doctor if:   You sweat at night.   You lose weight.   You pee (urinate) more than normal.   You pee less than normal, or you notice that your pee is a darker color than normal.   You feel weak.   You throw up (vomit).   Your skin or the whites of your eyes look yellow (jaundice).   Your skin:  ? Tingles.  ? Is numb.   Your rash:  ? Does not go away after a few days.  ? Gets worse.   You are:  ? More thirsty than normal.  ? More tired than normal.   You have:  ? New symptoms.  ? Pain in your belly (abdomen).  ? A fever.  ? Watery poop (diarrhea).  Get help right away if:   You have a fever and your symptoms suddenly get worse.   You start to feel mixed up (confused).   You have a very bad headache or a stiff neck.   You have very bad joint pains   or stiffness.   You have jerky movements that you cannot control (seizure).   Your rash covers all or most of your body. The rash may or may not be painful.   You have blisters that:  ? Are on top of the rash.  ? Grow larger.  ? Grow together.  ? Are painful.  ? Are inside your nose or mouth.   You have a rash that:  ? Looks like purple pinprick-sized spots all over your body.  ? Has a "bull's eye" or looks like a target.  ? Is red and painful, causes your skin to peel, and is not from being in the sun too long.  Summary   A rash is a change in the color of your skin. A rash can also change the way your skin feels.   The goal of treatment is to stop the itching and keep the rash from spreading.   Take or apply over-the-counter and prescription medicines only as told by your doctor.   Contact a doctor if you have new symptoms or symptoms that get worse.   Keep all follow-up visits as told by your doctor. This is  important.  This information is not intended to replace advice given to you by your health care provider. Make sure you discuss any questions you have with your health care provider.  Document Released: 02/06/2008 Document Revised: 03/24/2018 Document Reviewed: 03/24/2018  Elsevier Interactive Patient Education  2019 [employer]

## 2019-01-15 ENCOUNTER — Encounter: Payer: Self-pay | Admitting: Internal Medicine

## 2019-01-15 ENCOUNTER — Ambulatory Visit: Payer: 59 | Admitting: Internal Medicine

## 2019-01-15 ENCOUNTER — Other Ambulatory Visit: Payer: Self-pay

## 2019-01-15 VITALS — BP 118/80 | HR 76 | Temp 98.1°F | Wt 148.0 lb

## 2019-01-15 DIAGNOSIS — R102 Pelvic and perineal pain: Secondary | ICD-10-CM

## 2019-01-15 LAB — POC URINALSYSI DIPSTICK (AUTOMATED)
Bilirubin, UA: NEGATIVE
Glucose, UA: NEGATIVE
Ketones, UA: NEGATIVE
Leukocytes, UA: NEGATIVE
Nitrite, UA: NEGATIVE
Protein, UA: NEGATIVE
Spec Grav, UA: 1.025 (ref 1.010–1.025)
Urobilinogen, UA: 0.2 E.U./dL
pH, UA: 6 (ref 5.0–8.0)

## 2019-01-15 NOTE — Patient Instructions (Signed)

## 2019-01-15 NOTE — Progress Notes (Signed)
Subjective:    Patient ID: Kirk Davis, male    DOB: [birth year], 23 y.o.   MRN: [id number]  HPI  Pt presents to the clinic today with c/o lower abdominal pain. This started yesterday. He describes the pain as achy. The pain does not radiate. It is tender to touch. He reports he had a hard BM today. He occasionally has issues with constipation, and takes Mirilax as needed with good relief. He denies nausea, vomiting, diarrhea or blood in his stool. He denies urgency, frequency, dysuria, blood in his stool or penile discharge. He denies fever, chills or low back pain. He is sexually active. He has not taken anything OTC for this.  Review of Systems  No past medical history on file.  No current outpatient medications on file.   No current facility-administered medications for this visit.     No Known Allergies  Family History  Problem Relation Age of Onset  . Hyperlipidemia Mother   . Hyperlipidemia Father   . Hypertension Father   . Cancer Maternal Grandmother   . Hearing loss Maternal Grandmother   . Diabetes Maternal Grandfather   . Heart disease Maternal Grandfather   . Ovarian cancer Paternal Grandmother   . Hyperlipidemia Paternal Grandmother   . Hypertension Paternal Grandmother   . Depression Paternal Grandmother   . Arthritis Paternal Grandfather   . Prostate cancer Paternal Grandfather   . COPD Paternal Grandfather   . Heart disease Paternal Grandfather   . Hyperlipidemia Paternal Grandfather   . Hypertension Paternal Grandfather   . Stroke Paternal Grandfather     Social History   Socioeconomic History  . Marital status: Single    Spouse name: Not on file  . Number of children: Not on file  . Years of education: Not on file  . Highest education level: Not on file  Occupational History  . Not on file  Social Needs  . Financial resource strain: Not on file  . Food insecurity:    Worry: Not on file    Inability: Not on file  . Transportation needs:     Medical: Not on file    Non-medical: Not on file  Tobacco Use  . Smoking status: Never Smoker  . Smokeless tobacco: Never Used  Substance and Sexual Activity  . Alcohol use: No  . Drug use: No  . Sexual activity: Not on file  Lifestyle  . Physical activity:    Days per week: Not on file    Minutes per session: Not on file  . Stress: Not on file  Relationships  . Social connections:    Talks on phone: Not on file    Gets together: Not on file    Attends religious service: Not on file    Active member of club or organization: Not on file    Attends meetings of clubs or organizations: Not on file    Relationship status: Not on file  . Intimate partner violence:    Fear of current or ex partner: Not on file    Emotionally abused: Not on file    Physically abused: Not on file    Forced sexual activity: Not on file  Other Topics Concern  . Not on file  Social History Narrative  . Not on file     Constitutional: Denies fever, malaise, fatigue, headache or abrupt weight changes.  Respiratory: Denies difficulty breathing, shortness of breath, cough or sputum production.   Cardiovascular: Denies chest pain, chest tightness, palpitations  or swelling in the hands or feet.  Gastrointestinal: Pt reports abdominal pain, constipation. Denies bloating, diarrhea or blood in the stool.  GU: Denies urgency, frequency, pain with urination, burning sensation, blood in urine, odor or discharge. Skin: Denies redness, rashes, lesions or ulcercations.   No other specific complaints in a complete review of systems (except as listed in HPI above).     Objective:   Physical Exam  BP 118/80   Pulse 76   Temp 98.1 F (36.7 C) (Oral)   Wt 148 lb (67.1 kg)   SpO2 98%   BMI 21.86 kg/m  Wt Readings from Last 3 Encounters:  01/15/19 148 lb (67.1 kg)  11/05/18 150 lb (68 kg)  01/06/18 149 lb 8 oz (67.8 kg)    General: Appears his stated age, well developed, well nourished in NAD. Skin:  Warm, dry and intact. No rashes noted. Cardiovascular: Normal rate and rhythm. S1,S2 noted.  No murmur, rubs or gallops noted.  Pulmonary/Chest: Normal effort and positive vesicular breath sounds. No respiratory distress. No wheezes, rales or ronchi noted.  Abdomen: Soft and tender in the suprapubic area. Normal bowel sounds. No distention or masses noted. Liver, spleen and kidneys non palpable. Neurological: Alert and oriented.    BMET    Component Value Date/Time   NA 139 05/05/2015 1750   K 4.2 05/05/2015 1750   CL 102 05/05/2015 1750   CO2 28 05/05/2015 1750   GLUCOSE 87 05/05/2015 1750   BUN 14 05/05/2015 1750   CREATININE 1.03 05/05/2015 1750   CALCIUM 9.4 05/05/2015 1750   GFRNONAA >60 05/05/2015 1750   GFRAA >60 05/05/2015 1750    Lipid Panel  No results found for: CHOL, TRIG, HDL, CHOLHDL, VLDL, LDLCALC  CBC    Component Value Date/Time   WBC 9.2 05/05/2015 1750   RBC 4.81 05/05/2015 1750   HGB 13.2 05/05/2015 1750   HCT 40.6 05/05/2015 1750   PLT 277 05/05/2015 1750   MCV 84.4 05/05/2015 1750   MCH 27.4 05/05/2015 1750   MCHC 32.5 05/05/2015 1750   RDW 13.4 05/05/2015 1750   LYMPHSABS 2.0 05/05/2015 1750   MONOABS 0.9 05/05/2015 1750   EOSABS 0.3 05/05/2015 1750   BASOSABS 0.1 05/05/2015 1750    Hgb A1C No results found for: HGBA1C          Assessment & Plan:   Suprapubic Pain:  Urinalysis: normal Will send urine culture Will obtain urine for gonorrhea, chlamydia and trich Push fluids Will follow up after labs are back  Return precautions discussed Nicki Reaperegina Jahnessa Vanduyn, NP

## 2019-01-16 LAB — URINE CULTURE
MICRO NUMBER:: 474848
Result:: NO GROWTH
SPECIMEN QUALITY:: ADEQUATE

## 2019-01-16 LAB — TRICHOMONAS VAGINALIS RNA, QL,MALES: Trichomonas vaginalis RNA: NOT DETECTED

## 2019-01-16 LAB — C. TRACHOMATIS/N. GONORRHOEAE RNA
C. trachomatis RNA, TMA: NOT DETECTED
N. gonorrhoeae RNA, TMA: NOT DETECTED

## 2019-03-04 ENCOUNTER — Other Ambulatory Visit: Payer: Self-pay

## 2019-03-04 ENCOUNTER — Encounter: Payer: Self-pay | Admitting: Family Medicine

## 2019-03-04 ENCOUNTER — Ambulatory Visit: Payer: 59 | Admitting: Family Medicine

## 2019-03-04 ENCOUNTER — Ambulatory Visit: Payer: 59 | Admitting: Internal Medicine

## 2019-03-04 VITALS — BP 144/72 | HR 67 | Temp 98.1°F | Resp 18 | Ht 68.5 in | Wt 150.2 lb

## 2019-03-04 DIAGNOSIS — Z800 Family history of malignant neoplasm of digestive organs: Secondary | ICD-10-CM

## 2019-03-04 DIAGNOSIS — K648 Other hemorrhoids: Secondary | ICD-10-CM

## 2019-03-04 NOTE — Assessment & Plan Note (Signed)
Exam consistent. Advised fiber, increased hydration, and stool softener prn. If not improving, given family history will consider early GI referral for consideration for colonoscopy if bleeding persists

## 2019-03-04 NOTE — Patient Instructions (Signed)
You have a Hemorrhoid  Here are ways to prevent future issues and to treat your hemorrhoid 1) Make sure you get 25-35 g of INSOLUBLE fiber a day -- ideally from diet but you can also take a fiber supplement like Metamucil 2) Drink 1.5 to 2 liters of water daily  3) Avoid straining and prolonged periods on the toilet 4) Limit fatty foods and alcohol 5) Stool softener like Colace or Miralax  How to care for your hemorrhoid now - if painful 1) Sitz Baths and warm water sprays 2) Wipe with a wet wipe or flushable wipe  3) Consider using stool softeners (Like Colace or Miralax) 4) Topical Hemorrhoid cream (Preparation H is available over the counter)  5) Steroid suppository - though can be expensive  If not improvement in 6 weeks - return to clinic

## 2019-03-04 NOTE — Progress Notes (Signed)
   Subjective:     Kirk Davis is a 23 y.o. male presenting for Blood In Stools (has had trouble with constipation and hemorrhoids for about 2 years. Noticed some blood in toilet water on 03/01/2019. None since.)     HPI  # Blood in Stools - has had constipation for a few years - works a Agricultural consultant  - went for a week w/o going to the bathroom - has not been regular since then - notices a little blood when wiping - on Sunday noticed it in the stool - Dad was recently diagnosed with colon cancer - at age 63 (part of the colon removed in December) - no pain with BM - does have some burning symptoms - just difficulty going to the bathroom  - no heartburn - Water intake: 2-3 bottles of water per day - Diet: works a Scientist, physiological - tries to eat healthy, but typically fastfood at lunch - Treatment: miralax when things are worse - may use for a couple of days  Review of Systems  Constitutional: Negative for chills, fatigue and fever.  Gastrointestinal: Positive for blood in stool and constipation. Negative for abdominal pain, nausea, rectal pain and vomiting.     Social History   Tobacco Use  Smoking Status Never Smoker  Smokeless Tobacco Never Used        Objective:    BP Readings from Last 3 Encounters:  03/04/19 (!) 144/72  01/15/19 118/80  11/05/18 122/76   Wt Readings from Last 3 Encounters:  03/04/19 150 lb 4 oz (68.2 kg)  01/15/19 148 lb (67.1 kg)  11/05/18 150 lb (68 kg)    BP (!) 144/72   Pulse 67   Temp 98.1 F (36.7 C)   Resp 18   Ht 5' 8.5" (1.74 m) Comment: measured height today  Wt 150 lb 4 oz (68.2 kg)   BMI 22.51 kg/m    Physical Exam Exam conducted with a chaperone present.  Constitutional:      Appearance: Normal appearance. He is not ill-appearing or diaphoretic.  HENT:     Right Ear: External ear normal.     Left Ear: External ear normal.     Nose: Nose normal.  Eyes:     General: No scleral icterus.    Extraocular  Movements: Extraocular movements intact.     Conjunctiva/sclera: Conjunctivae normal.  Neck:     Musculoskeletal: Neck supple.  Cardiovascular:     Rate and Rhythm: Normal rate.  Pulmonary:     Effort: Pulmonary effort is normal.  Genitourinary:    Rectum: Internal hemorrhoid present. No external hemorrhoid.  Skin:    General: Skin is warm and dry.  Neurological:     Mental Status: He is alert. Mental status is at baseline.  Psychiatric:        Mood and Affect: Mood normal.           Assessment & Plan:   Problem List Items Addressed This Visit      Cardiovascular and Mediastinum   Internal hemorrhoid, bleeding - Primary    Exam consistent. Advised fiber, increased hydration, and stool softener prn. If not improving, given family history will consider early GI referral for consideration for colonoscopy if bleeding persists        Other   Family hx of colon cancer       Return in about 6 weeks (around 04/15/2019).  Lesleigh Noe, MD

## 2019-04-22 ENCOUNTER — Encounter: Payer: Self-pay | Admitting: Internal Medicine

## 2019-04-22 ENCOUNTER — Ambulatory Visit: Payer: Self-pay | Admitting: Internal Medicine

## 2019-04-22 ENCOUNTER — Other Ambulatory Visit: Payer: Self-pay

## 2019-04-22 VITALS — BP 139/85 | HR 71 | Temp 98.4°F | Resp 16 | Ht 68.5 in | Wt 151.0 lb

## 2019-04-22 DIAGNOSIS — Z0000 Encounter for general adult medical examination without abnormal findings: Secondary | ICD-10-CM

## 2019-04-22 DIAGNOSIS — Z021 Encounter for pre-employment examination: Secondary | ICD-10-CM

## 2019-04-22 LAB — POCT URINALYSIS DIPSTICK
Bilirubin, UA: NEGATIVE
Blood, UA: NEGATIVE
Glucose, UA: NEGATIVE
Ketones, UA: NEGATIVE
Leukocytes, UA: NEGATIVE
Nitrite, UA: NEGATIVE
Protein, UA: NEGATIVE
Spec Grav, UA: 1.01 (ref 1.010–1.025)
Urobilinogen, UA: 0.2 E.U./dL
pH, UA: 7 (ref 5.0–8.0)

## 2019-04-22 NOTE — Progress Notes (Signed)
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  No specific complaints, denies any recent CP, palpitations, SOB, abdominal pains, change in bowel habits, did have BRBPR in the fairly recent past and saw a provider and told hemorrhoid source, no vision changes, no recent fevers, or other Covid concerning sx's, no LE swlling, no joint pains or swelling. 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 No problems urinating.  Exercise - tries to exercise at least once to twice a week, and tries to stay active with work    Meds reviewed - none presently (uses zyrtec and flonase seasonally, with the miralax used in the past prn)  Current Outpatient Medications on File Prior to Visit  Medication Sig Dispense Refill  . cetirizine (ZYRTEC) 10 MG tablet Take 10 mg by mouth daily.    . fluticasone (FLONASE) 50 MCG/ACT nasal spray Place into both nostrils daily.    . polyethylene glycol (MIRALAX / GLYCOLAX) 17 g packet Take 17 g by mouth as needed.     No current facility-administered medications on file prior to visit.      No Known Allergies  Social History   Tobacco Use  Smoking Status Never Smoker  Smokeless Tobacco Never Used     FH - reviewed and as noted  O - NAD, masked  BP 139/85 (BP Location: Left Arm, Patient Position: Sitting, Cuff Size: Normal)   Pulse 71   Temp 98.4 F (36.9 C) (Oral)   Resp 16   Ht 5' 8.5" (1.74 m)   Wt 151 lb (68.5 kg)   SpO2 100%   BMI 22.63 kg/m   HEENT - sclera anicteric, PERRL, EOMI, conj - non-inj'ed, No sinus tenderness, TM's and canals clear Neck - supple, no adenopathy, no TM, carotids 2+ and = without bruits bilat Car - RRR without m/g/r Pulm- CTA without wheeze or rales Abd - soft, NT, ND, BS+, no obvious HSM, no masses Back - no CVA tenderness Skin- no new lesions of concern on exposed areas, denied otherwise Ext - no LE  edema, no active joints GU - no swelling in inguinal/suprapubic region, NT, no inguinal hernia on check, testicles without lumps  Neuro - affect was not flat, appropriate with conversation  Grossly non-focal with good strength on testing, sensation intact to LT in distal extremities, DTR's 2+ and = patella, Romberg neg, no pronator drift, good balance on one foot, good finger to nose, good RAMs  Labs pending Hearing and vision screens ok  Ass/Plan: 1. Pre-employment PE - no concerns on evaluation today  F/u Friday for PPD reading Await lab results  2. H/o hemorrhoids noted

## 2019-04-23 LAB — CMP12+LP+TP+TSH+6AC+CBC/D/PLT
ALT: 16 IU/L (ref 0–44)
AST: 20 IU/L (ref 0–40)
Albumin/Globulin Ratio: 2.1 (ref 1.2–2.2)
Albumin: 4.9 g/dL (ref 4.1–5.2)
Alkaline Phosphatase: 74 IU/L (ref 39–117)
BUN/Creatinine Ratio: 11 (ref 9–20)
BUN: 12 mg/dL (ref 6–20)
Basophils Absolute: 0.1 10*3/uL (ref 0.0–0.2)
Basos: 1 %
Bilirubin Total: 0.5 mg/dL (ref 0.0–1.2)
Calcium: 10.1 mg/dL (ref 8.7–10.2)
Chloride: 100 mmol/L (ref 96–106)
Chol/HDL Ratio: 3.3 ratio (ref 0.0–5.0)
Cholesterol, Total: 156 mg/dL (ref 100–199)
Creatinine, Ser: 1.09 mg/dL (ref 0.76–1.27)
EOS (ABSOLUTE): 0.1 10*3/uL (ref 0.0–0.4)
Eos: 3 %
Estimated CHD Risk: <0.5 times avg. (ref 0.0–1.0)
Free Thyroxine Index: 2.1 (ref 1.2–4.9)
GFR calc Af Amer: 111 mL/min/{1.73_m2} (ref >59)
GFR calc non Af Amer: 96 mL/min/{1.73_m2} (ref >59)
GGT: 16 IU/L (ref 0–65)
Globulin, Total: 2.3 g/dL (ref 1.5–4.5)
Glucose: 71 mg/dL (ref 65–99)
HDL: 48 mg/dL (ref >39)
Hematocrit: 44.3 % (ref 37.5–51.0)
Hemoglobin: 15.1 g/dL (ref 13.0–17.7)
Immature Grans (Abs): 0 10*3/uL (ref 0.0–0.1)
Immature Granulocytes: 0 %
Iron: 106 ug/dL (ref 38–169)
LDH: 164 IU/L (ref 121–224)
LDL Calculated: 93 mg/dL (ref 0–99)
Lymphocytes Absolute: 1.4 10*3/uL (ref 0.7–3.1)
Lymphs: 31 %
MCH: 28.1 pg (ref 26.6–33.0)
MCHC: 34.1 g/dL (ref 31.5–35.7)
MCV: 83 fL (ref 79–97)
Monocytes Absolute: 0.5 10*3/uL (ref 0.1–0.9)
Monocytes: 11 %
Neutrophils Absolute: 2.4 10*3/uL (ref 1.4–7.0)
Neutrophils: 54 %
Phosphorus: 3.1 mg/dL (ref 2.8–4.1)
Platelets: 260 10*3/uL (ref 150–450)
Potassium: 4.4 mmol/L (ref 3.5–5.2)
RBC: 5.37 x10E6/uL (ref 4.14–5.80)
RDW: 11.8 % (ref 11.6–15.4)
Sodium: 141 mmol/L (ref 134–144)
T3 Uptake Ratio: 29 % (ref 24–39)
T4, Total: 7.3 ug/dL (ref 4.5–12.0)
TSH: 1.01 u[IU]/mL (ref 0.450–4.500)
Total Protein: 7.2 g/dL (ref 6.0–8.5)
Triglycerides: 75 mg/dL (ref 0–149)
Uric Acid: 5.8 mg/dL (ref 3.7–8.6)
VLDL Cholesterol Cal: 15 mg/dL (ref 5–40)
WBC: 4.5 10*3/uL (ref 3.4–10.8)

## 2019-04-23 LAB — HEPATITIS B SURFACE ANTIBODY,QUALITATIVE: Hep B Surface Ab, Qual: REACTIVE

## 2019-04-24 LAB — TB SKIN TEST
Induration: 0 mm
TB Skin Test: NEGATIVE

## 2019-06-18 ENCOUNTER — Other Ambulatory Visit: Payer: Self-pay

## 2019-06-18 ENCOUNTER — Ambulatory Visit: Payer: Self-pay

## 2019-06-18 DIAGNOSIS — Z0000 Encounter for general adult medical examination without abnormal findings: Secondary | ICD-10-CM

## 2019-06-18 LAB — POCT URINALYSIS DIPSTICK
Bilirubin, UA: NEGATIVE
Blood, UA: NEGATIVE
Glucose, UA: NEGATIVE
Ketones, UA: NEGATIVE
Leukocytes, UA: NEGATIVE
Nitrite, UA: NEGATIVE
Protein, UA: NEGATIVE
Spec Grav, UA: 1.02 (ref 1.010–1.025)
Urobilinogen, UA: 0.2 E.U./dL
pH, UA: 6 (ref 5.0–8.0)

## 2019-06-19 LAB — CMP12+LP+TP+TSH+6AC+PSA+CBC?
AST: 17 IU/L (ref 0–40)
Basophils Absolute: 0 10*3/uL (ref 0.0–0.2)
Calcium: 9.7 mg/dL (ref 8.7–10.2)
Chloride: 102 mmol/L (ref 96–106)
Immature Grans (Abs): 0 10*3/uL (ref 0.0–0.1)
Immature Granulocytes: 0 %
Iron: 98 ug/dL (ref 38–169)
LDL Chol Calc (NIH): 83 mg/dL (ref 0–99)
Lymphs: 31 %
Monocytes Absolute: 0.5 10*3/uL (ref 0.1–0.9)
Neutrophils Absolute: 2.6 10*3/uL (ref 1.4–7.0)
Platelets: 253 10*3/uL (ref 150–450)
Potassium: 4.7 mmol/L (ref 3.5–5.2)
RBC: 5.22 x10E6/uL (ref 4.14–5.80)
Sodium: 141 mmol/L (ref 134–144)
T4, Total: 7.2 ug/dL (ref 4.5–12.0)
Uric Acid: 5.7 mg/dL (ref 3.7–8.6)
VLDL Cholesterol Cal: 15 mg/dL (ref 5–40)

## 2019-06-19 LAB — CMP12+LP+TP+TSH+6AC+PSA+CBC…
ALT: 12 IU/L (ref 0–44)
Albumin/Globulin Ratio: 2 (ref 1.2–2.2)
Albumin: 4.9 g/dL (ref 4.1–5.2)
Alkaline Phosphatase: 76 IU/L (ref 39–117)
BUN/Creatinine Ratio: 13 (ref 9–20)
BUN: 14 mg/dL (ref 6–20)
Basos: 1 %
Bilirubin Total: 0.4 mg/dL (ref 0.0–1.2)
Chol/HDL Ratio: 3.1 ratio (ref 0.0–5.0)
Cholesterol, Total: 144 mg/dL (ref 100–199)
Creatinine, Ser: 1.05 mg/dL (ref 0.76–1.27)
EOS (ABSOLUTE): 0.1 10*3/uL (ref 0.0–0.4)
Eos: 2 %
Estimated CHD Risk: <0.5 times avg. (ref 0.0–1.0)
Free Thyroxine Index: 2 (ref 1.2–4.9)
GFR calc Af Amer: 116 mL/min/{1.73_m2} (ref >59)
GFR calc non Af Amer: 100 mL/min/{1.73_m2} (ref >59)
GGT: 15 IU/L (ref 0–65)
Globulin, Total: 2.4 g/dL (ref 1.5–4.5)
Glucose: 79 mg/dL (ref 65–99)
HDL: 46 mg/dL (ref >39)
Hematocrit: 44.1 % (ref 37.5–51.0)
Hemoglobin: 14.3 g/dL (ref 13.0–17.7)
LDH: 146 IU/L (ref 121–224)
Lymphocytes Absolute: 1.5 10*3/uL (ref 0.7–3.1)
MCH: 27.4 pg (ref 26.6–33.0)
MCHC: 32.4 g/dL (ref 31.5–35.7)
MCV: 85 fL (ref 79–97)
Monocytes: 11 %
Neutrophils: 55 %
Phosphorus: 4.1 mg/dL (ref 2.8–4.1)
Prostate Specific Ag, Serum: 1 ng/mL (ref 0.0–4.0)
RDW: 12.1 % (ref 11.6–15.4)
T3 Uptake Ratio: 28 % (ref 24–39)
TSH: 2.33 u[IU]/mL (ref 0.450–4.500)
Total Protein: 7.3 g/dL (ref 6.0–8.5)
Triglycerides: 78 mg/dL (ref 0–149)
WBC: 4.7 10*3/uL (ref 3.4–10.8)

## 2019-06-24 ENCOUNTER — Ambulatory Visit: Payer: Self-pay

## 2019-06-24 DIAGNOSIS — Z021 Encounter for pre-employment examination: Secondary | ICD-10-CM

## 2019-06-26 LAB — POCT URINALYSIS DIPSTICK
Bilirubin, UA: NEGATIVE
Blood, UA: NEGATIVE
Glucose, UA: NEGATIVE
Ketones, UA: NEGATIVE
Leukocytes, UA: NEGATIVE
Nitrite, UA: NEGATIVE
Protein, UA: NEGATIVE
Spec Grav, UA: 1.02 (ref 1.010–1.025)
Urobilinogen, UA: 0.2 E.U./dL
pH, UA: 6.5 (ref 5.0–8.0)

## 2019-07-27 ENCOUNTER — Ambulatory Visit: Payer: Managed Care, Other (non HMO) | Admitting: Occupational Medicine

## 2019-07-27 ENCOUNTER — Encounter: Payer: Self-pay | Admitting: Occupational Medicine

## 2019-07-27 ENCOUNTER — Other Ambulatory Visit: Payer: Self-pay

## 2019-07-27 VITALS — BP 140/66 | HR 79 | Temp 98.8°F | Resp 12 | Ht 68.0 in | Wt 158.0 lb

## 2019-07-27 DIAGNOSIS — Z0000 Encounter for general adult medical examination without abnormal findings: Secondary | ICD-10-CM

## 2019-08-22 ENCOUNTER — Other Ambulatory Visit: Payer: Self-pay

## 2019-08-22 ENCOUNTER — Ambulatory Visit
Admission: EM | Admit: 2019-08-22 | Discharge: 2019-08-22 | Disposition: A | Discharge status: Home / Self Care | Payer: Managed Care, Other (non HMO) | Attending: Family Medicine | Admitting: Family Medicine

## 2019-08-22 ENCOUNTER — Encounter: Payer: Self-pay | Admitting: Emergency Medicine

## 2019-08-22 DIAGNOSIS — Z20822 Contact with and (suspected) exposure to covid-19: Secondary | ICD-10-CM

## 2019-08-22 DIAGNOSIS — Z20828 Contact with and (suspected) exposure to other viral communicable diseases: Secondary | ICD-10-CM

## 2019-08-22 NOTE — ED Provider Notes (Signed)
[facility] ____________________________________________  Time seen: Approximately 5:05 PM  I have reviewed the triage vital signs and the nursing notes.   HISTORY  Chief Complaint COVID Test   HPI Kirk Davis is a 23 y.o. male presenting for COVID-19 testing.  Patient reports he was directly exposed from a coworker this past week after riding in a car with a coworker that then subsequently tested positive.  Patient denies complaints. Denies chest pain or shortness of breath, congestion, sore throat, vomiting, diarrhea, fevers, changes in taste or smell.  States he feels well.  Denies aggravating or alleviating factors.   History reviewed. No pertinent past medical history.  Patient Active Problem List   Diagnosis Date Noted  . Internal hemorrhoid, bleeding 03/04/2019  . Family hx of colon cancer 03/04/2019    Past Surgical History:  Procedure Laterality Date  . HERNIA REPAIR       No current facility-administered medications for this encounter.  Current Outpatient Medications:  .  cetirizine (ZYRTEC) 10 MG tablet, Take 10 mg by mouth daily., Disp: , Rfl:  .  fluticasone (FLONASE) 50 MCG/ACT nasal spray, Place into both nostrils daily., Disp: , Rfl:  .  polyethylene glycol (MIRALAX / GLYCOLAX) 17 g packet, Take 17 g by mouth as needed., Disp: , Rfl:   Allergies Patient has no known allergies.  Family History  Problem Relation Age of Onset  . Hyperlipidemia Mother   . Hyperlipidemia Father   . Hypertension Father   . Colon cancer Father 58  . Cancer Maternal Grandmother   . Hearing loss Maternal Grandmother   . Diabetes Maternal Grandfather   . Heart disease Maternal Grandfather   . Ovarian cancer Paternal Grandmother   . Hyperlipidemia Paternal Grandmother   . Hypertension Paternal Grandmother   . Depression Paternal Grandmother   . Arthritis Paternal Grandfather   . Prostate cancer Paternal Grandfather   . COPD Paternal Grandfather   .  Heart disease Paternal Grandfather   . Hyperlipidemia Paternal Grandfather   . Hypertension Paternal Grandfather   . Stroke Paternal Grandfather     Social History Social History   Tobacco Use  . Smoking status: Never Smoker  . Smokeless tobacco: Never Used  Substance Use Topics  . Alcohol use: No  . Drug use: No    Review of Systems Constitutional: No fever. ENT: No sore throat. Cardiovascular: Denies chest pain. Respiratory: Denies shortness of breath. Gastrointestinal: No abdominal pain.  No nausea, no vomiting.  No diarrhea.   Musculoskeletal: Negative for back pain. Skin: Negative for rash.  ____________________________________________   PHYSICAL EXAM:  VITAL SIGNS: ED Triage Vitals  Enc Vitals Group     BP 08/22/19 1450 (!) 141/89     Pulse Rate 08/22/19 1450 88     Resp 08/22/19 1450 16     Temp 08/22/19 1450 98.4 F (36.9 C)     Temp Source 08/22/19 1450 Oral     SpO2 08/22/19 1450 100 %     Weight 08/22/19 1448 150 lb (68 kg)     Height 08/22/19 1448 5\' 8"  (1.727 m)     Head Circumference --      Peak Flow --      Pain Score 08/22/19 1448 0     Pain Loc --      Pain Edu? --      Excl. in [place]? --     Constitutional: Alert and oriented. Well appearing and in no acute distress. ENT  Head: Normocephalic and atraumatic. Cardiovascular: Normal heart rate Respiratory: Normal respiratory effort without tachypnea nor retractions Musculoskeletal: Steady gait Neurologic:  Normal speech and language.  Skin:  Skin is dry Psychiatric: Mood and affect are normal. Speech and behavior are normal. Patient exhibits appropriate insight and judgment   ___________________________________________   LABS (all labs ordered are listed, but only abnormal results are displayed)  Labs Reviewed  NOVEL CORONAVIRUS, NAA (HOSP ORDER, SEND-OUT TO REF LAB; TAT 18-24 HRS)     PROCEDURES Procedures    INITIAL IMPRESSION / ASSESSMENT AND PLAN / ED COURSE  Pertinent  labs & imaging results that were available during my care of the patient were reviewed by me and considered in my medical decision making (see chart for details).  Well-appearing patient.  Presumably COVID-19 testing post exposure.  Asymptomatic.  COVID-19 testing completed and advice given.  Monitor.  Discussed follow up and return parameters including no resolution or any worsening concerns. Patient verbalized understanding and agreed to plan.   ____________________________________________   FINAL CLINICAL IMPRESSION(S) / ED DIAGNOSES  Final diagnoses:  Exposure to COVID-19 virus     ED Discharge Orders    None       Note: This dictation was prepared with Dragon dictation along with smaller phrase technology. Any transcriptional errors that result from this process are unintentional.         Renford Dills, NP 08/22/19 1707

## 2019-08-22 NOTE — ED Triage Notes (Signed)
Patient was around a coworker on Wed and was tested and was positive for COVID today.  Patient denies symptoms.

## 2019-08-23 LAB — NOVEL CORONAVIRUS, NAA (HOSP ORDER, SEND-OUT TO REF LAB; TAT 18-24 HRS): SARS-CoV-2, NAA: NOT DETECTED

## 2019-10-09 NOTE — Progress Notes (Signed)
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

## 2019-10-12 ENCOUNTER — Other Ambulatory Visit: Payer: Self-pay

## 2019-10-12 ENCOUNTER — Ambulatory Visit: Payer: Self-pay

## 2019-10-12 DIAGNOSIS — Z0000 Encounter for general adult medical examination without abnormal findings: Secondary | ICD-10-CM

## 2019-10-12 LAB — POCT URINALYSIS DIPSTICK
Bilirubin, UA: NEGATIVE
Blood, UA: NEGATIVE
Glucose, UA: NEGATIVE
Ketones, UA: NEGATIVE
Leukocytes, UA: NEGATIVE
Nitrite, UA: NEGATIVE
Protein, UA: NEGATIVE
Spec Grav, UA: 1.02 (ref 1.010–1.025)
Urobilinogen, UA: 0.2 E.U./dL
pH, UA: 6 (ref 5.0–8.0)

## 2019-10-15 LAB — QUANTIFERON-TB GOLD PLUS
QuantiFERON Mitogen Value: >10 IU/mL
QuantiFERON Nil Value: 0.01 IU/mL
QuantiFERON TB1 Ag Value: 0.01 IU/mL
QuantiFERON TB2 Ag Value: 0.02 IU/mL
QuantiFERON-TB Gold Plus: NEGATIVE

## 2019-10-15 LAB — CMP12+LP+TP+TSH+6AC+CBC/D/PLT
ALT: 13 IU/L (ref 0–44)
AST: 22 IU/L (ref 0–40)
Albumin/Globulin Ratio: 1.5 (ref 1.2–2.2)
Albumin: 4.2 g/dL (ref 4.1–5.2)
Alkaline Phosphatase: 75 IU/L (ref 39–117)
BUN/Creatinine Ratio: 13 (ref 9–20)
BUN: 14 mg/dL (ref 6–20)
Basophils Absolute: 0.1 10*3/uL (ref 0.0–0.2)
Basos: 1 %
Bilirubin Total: 0.4 mg/dL (ref 0.0–1.2)
Calcium: 9.3 mg/dL (ref 8.7–10.2)
Chloride: 103 mmol/L (ref 96–106)
Chol/HDL Ratio: 3.1 ratio (ref 0.0–5.0)
Cholesterol, Total: 152 mg/dL (ref 100–199)
Creatinine, Ser: 1.07 mg/dL (ref 0.76–1.27)
EOS (ABSOLUTE): 0.1 10*3/uL (ref 0.0–0.4)
Eos: 3 %
Estimated CHD Risk: <0.5 times avg. (ref 0.0–1.0)
Free Thyroxine Index: 2.1 (ref 1.2–4.9)
GFR calc Af Amer: 113 mL/min/{1.73_m2} (ref >59)
GFR calc non Af Amer: 98 mL/min/{1.73_m2} (ref >59)
GGT: 16 IU/L (ref 0–65)
Globulin, Total: 2.8 g/dL (ref 1.5–4.5)
Glucose: 90 mg/dL (ref 65–99)
HDL: 49 mg/dL (ref >39)
Hematocrit: 46.5 % (ref 37.5–51.0)
Hemoglobin: 15.4 g/dL (ref 13.0–17.7)
Immature Grans (Abs): 0 10*3/uL (ref 0.0–0.1)
Immature Granulocytes: 0 %
Iron: 70 ug/dL (ref 38–169)
LDH: 156 IU/L (ref 121–224)
LDL Chol Calc (NIH): 89 mg/dL (ref 0–99)
Lymphocytes Absolute: 1.4 10*3/uL (ref 0.7–3.1)
Lymphs: 31 %
MCH: 28.3 pg (ref 26.6–33.0)
MCHC: 33.1 g/dL (ref 31.5–35.7)
MCV: 85 fL (ref 79–97)
Monocytes Absolute: 0.4 10*3/uL (ref 0.1–0.9)
Monocytes: 10 %
Neutrophils Absolute: 2.5 10*3/uL (ref 1.4–7.0)
Neutrophils: 55 %
Phosphorus: 3.2 mg/dL (ref 2.8–4.1)
Platelets: 253 10*3/uL (ref 150–450)
Potassium: 4.3 mmol/L (ref 3.5–5.2)
RBC: 5.45 x10E6/uL (ref 4.14–5.80)
RDW: 12.6 % (ref 11.6–15.4)
Sodium: 141 mmol/L (ref 134–144)
T3 Uptake Ratio: 30 % (ref 24–39)
T4, Total: 7 ug/dL (ref 4.5–12.0)
TSH: 1.24 u[IU]/mL (ref 0.450–4.500)
Total Protein: 7 g/dL (ref 6.0–8.5)
Triglycerides: 70 mg/dL (ref 0–149)
Uric Acid: 5.8 mg/dL (ref 3.8–8.4)
VLDL Cholesterol Cal: 14 mg/dL (ref 5–40)
WBC: 4.5 10*3/uL (ref 3.4–10.8)

## 2019-10-17 IMAGING — DX DG KNEE COMPLETE 4+V*L*
4 series · 4 of 4 positions shown · non-contrast
Comparison: None.

CLINICAL DATA: Lateral left knee pain, no known injury, initial
encounter

EXAM:
LEFT KNEE - COMPLETE 4+ VIEW

[knee ap]
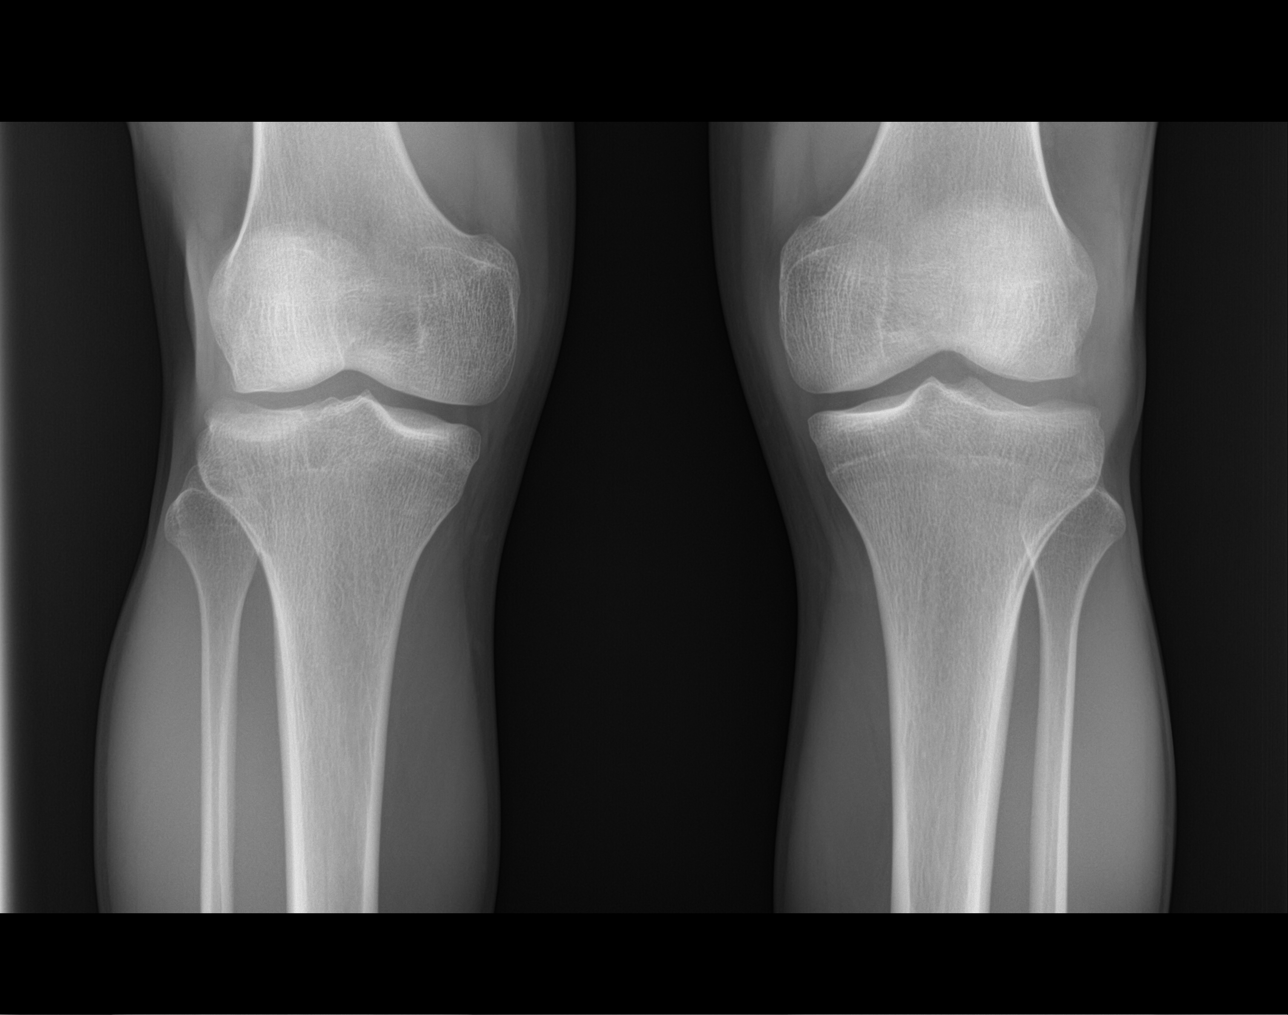

[knee lat]
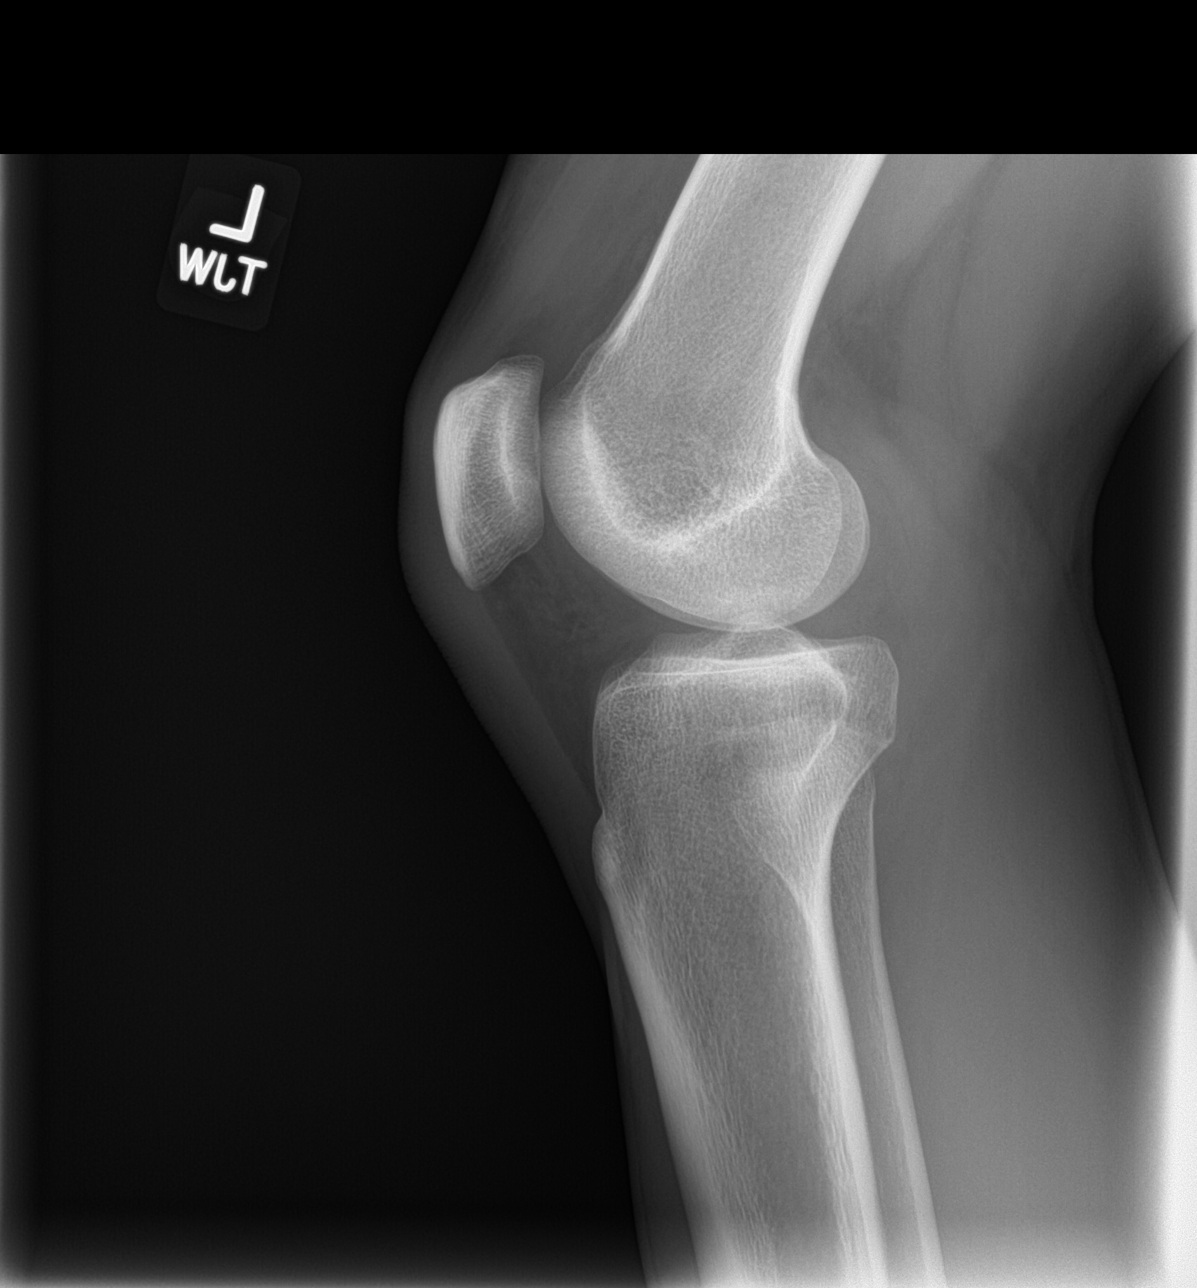

[patella skyline]
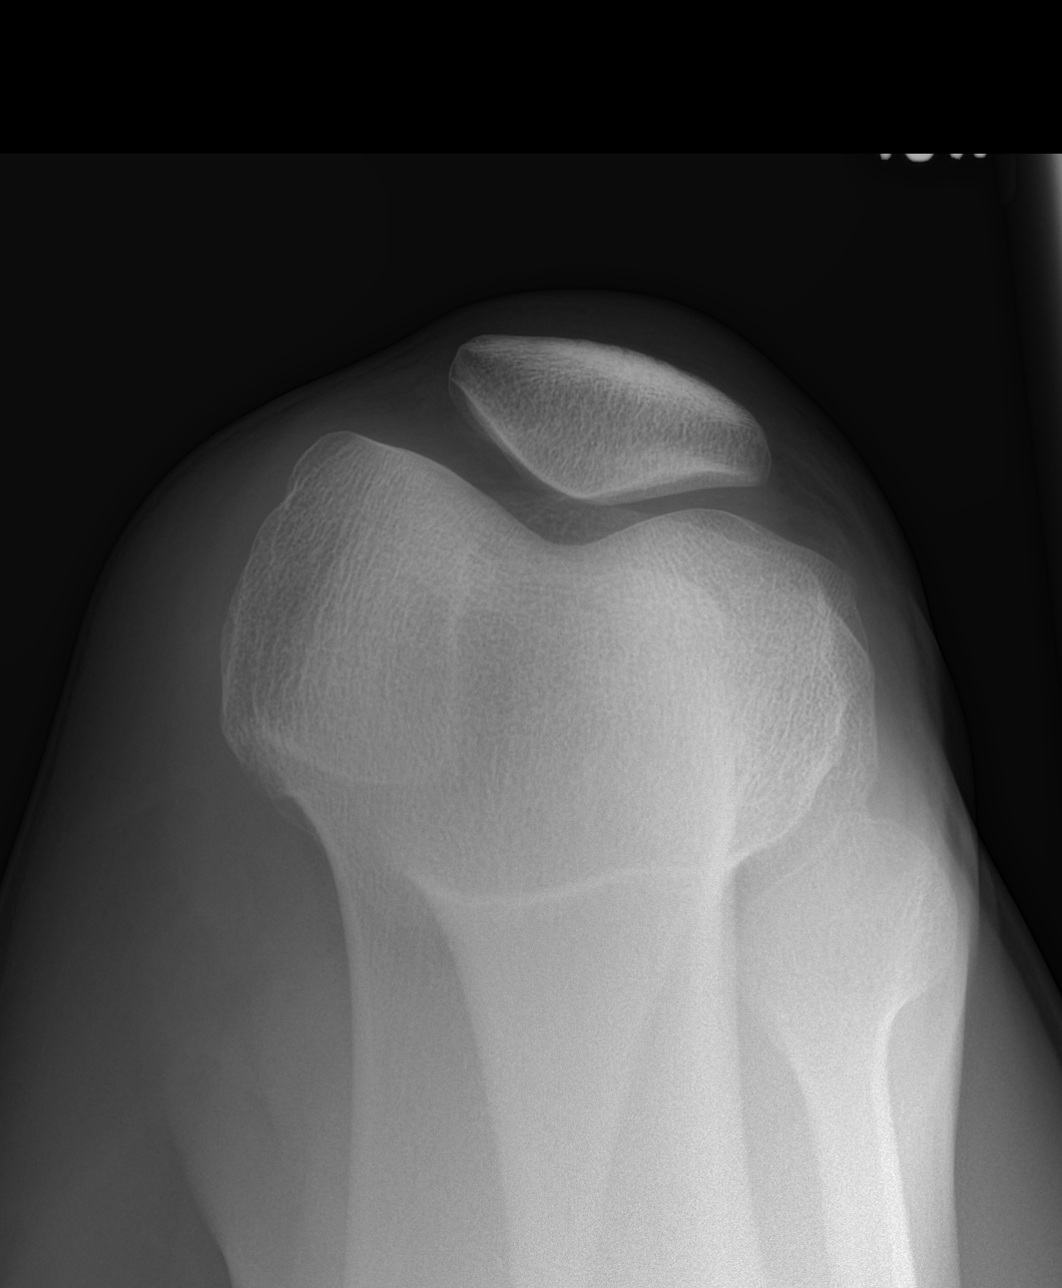

[knee obl]
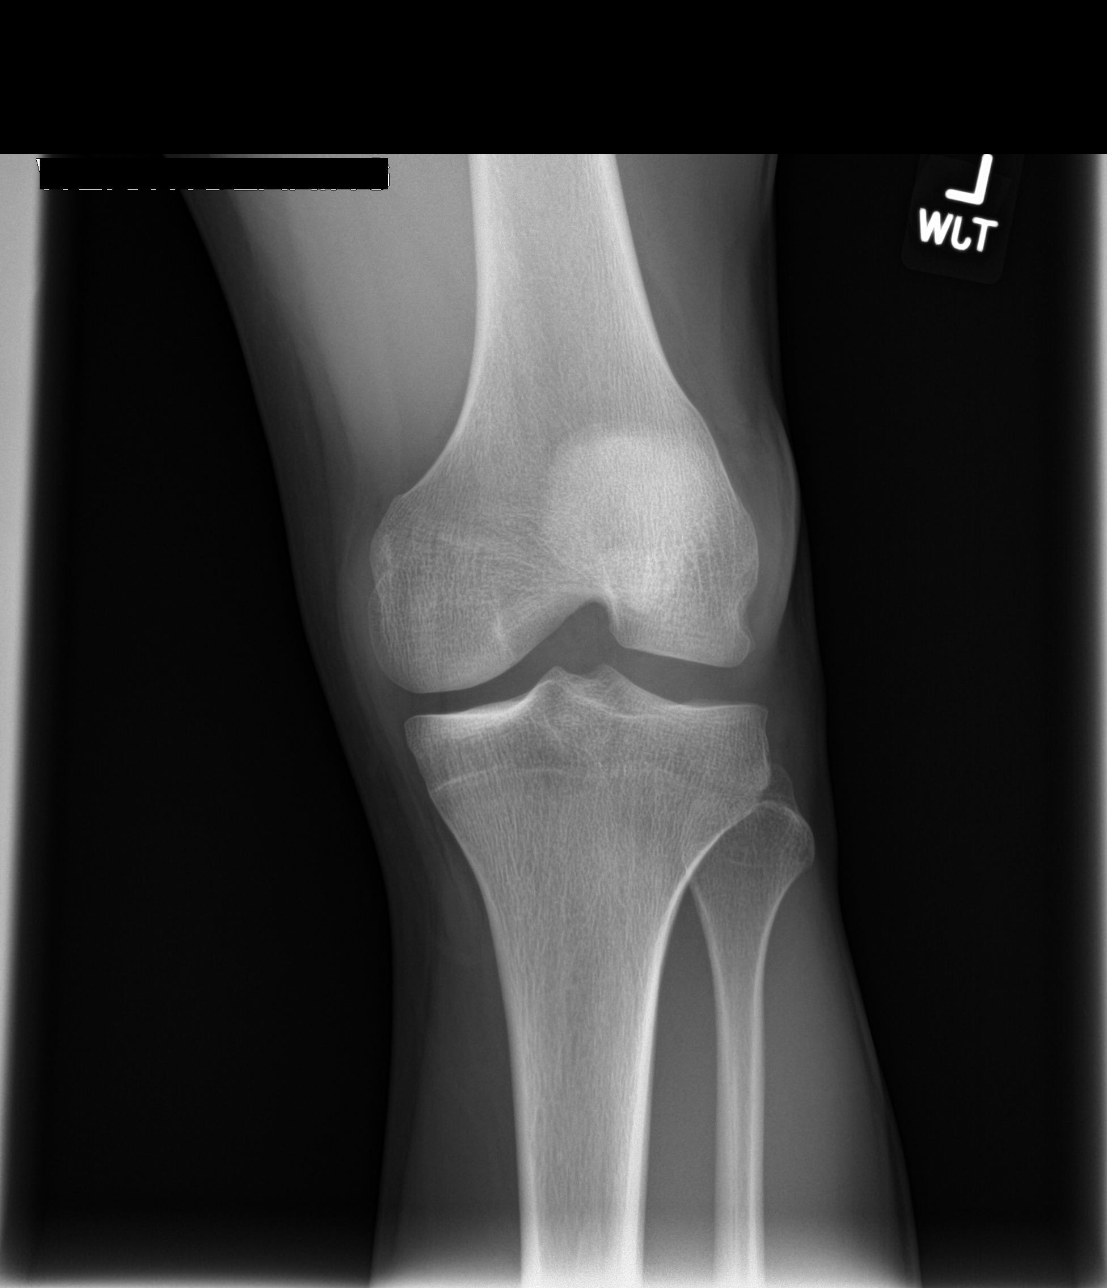

[4 of 4 positions shown; findings below may reference images not displayed]

FINDINGS: No acute fracture or dislocation is noted. Thickening is noted along
the lateral aspect of the left knee which may be related to the
given clinical history of lateral collateral ligament injury. Some
increased density is noted in the infrapatellar fat pad which may
represent some early inflammatory change.
IMPRESSION: Soft tissue swelling noted laterally consistent with the given
clinical history of lateral collateral ligament injury. No acute
bony abnormality is noted.

## 2019-10-20 ENCOUNTER — Other Ambulatory Visit: Payer: Self-pay

## 2019-10-20 ENCOUNTER — Ambulatory Visit: Payer: Self-pay | Admitting: Occupational Medicine

## 2019-10-20 VITALS — BP 140/62 | HR 79 | Temp 98.1°F | Ht 69.0 in | Wt 154.6 lb

## 2019-10-20 DIAGNOSIS — Z0000 Encounter for general adult medical examination without abnormal findings: Secondary | ICD-10-CM

## 2019-10-20 NOTE — Progress Notes (Signed)
  Subjective:     Patient ID: Kirk Davis, male   DOB: 1996-03-27, 24 y.o.   MRN: [id number]  HPI  In for annual firefighter exam    All notes in paper chart located in onsite clinic at the city of [place].   Review of Systems     Objective:   Physical Exam     Assessment:    annual employment exam    Plan:     OK full duty

## 2020-01-28 ENCOUNTER — Other Ambulatory Visit: Payer: Self-pay

## 2020-01-28 ENCOUNTER — Encounter: Payer: Self-pay | Admitting: Primary Care

## 2020-01-28 ENCOUNTER — Ambulatory Visit (INDEPENDENT_AMBULATORY_CARE_PROVIDER_SITE_OTHER): Payer: 59 | Admitting: Primary Care

## 2020-01-28 DIAGNOSIS — W57XXXA Bitten or stung by nonvenomous insect and other nonvenomous arthropods, initial encounter: Secondary | ICD-10-CM | POA: Diagnosis not present

## 2020-01-28 DIAGNOSIS — S20462A Insect bite (nonvenomous) of left back wall of thorax, initial encounter: Secondary | ICD-10-CM

## 2020-01-28 NOTE — Assessment & Plan Note (Signed)
Tick bite to left lower thoracic back, doesn't appear infected or to have evidence of Lyme Disease.  Suspect his symptoms are secondary to viral etiology, perhaps Covid, especially since his girlfriend has the same symptoms. Encouraged testing.   Discussed warning signs from tick bite, he will monitor and report.

## 2020-01-28 NOTE — Progress Notes (Signed)
Subjective:    Patient ID: Kirk Davis, male    DOB: 03-Apr-1996, 24 y.o.   MRN: [id number]  HPI  This visit occurred during the SARS-CoV-2 public health emergency.  Safety protocols were in place, including screening questions prior to the visit, additional usage of staff PPE, and extensive cleaning of exam room while observing appropriate contact time as indicated for disinfecting solutions.   Kirk Davis is a 24 year old male patient of Nicki Reaper who presents today with a chief complaint of tick bites.  Bites are located to the right medial ankle and left lower thoracic back. He found two ticks to the right medial ankle about one week ago and one tick to the thoracic back yesterday. The sites are itchy.   Symptoms include cough, headache, flushing with sweats, body aches, fatigue which began one day prior to finding the ticks. The ticks were very small, not engorged. 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   His girlfriend has had similar symptoms as noted above.  He denies fevers, loss of smell, known exposure to Covid-19.  He has noticed an altered taste.   Review of Systems  Constitutional: Positive for fatigue.  HENT: Positive for congestion. Negative for sore throat.   Respiratory: Positive for cough. Negative for shortness of breath.   Musculoskeletal: Positive for arthralgias.  Skin:       Tick bites  Neurological: Positive for headaches.       No past medical history on file.   Social History   Socioeconomic History  . Marital status: Single    Spouse name: Not on file  . Number of children: Not on file  . Years of education: Not on file  . Highest education level: Not on file  Occupational History  . Not on file  Tobacco Use  . Smoking status: Never Smoker  . Smokeless tobacco: Never Used  Substance and Sexual Activity  . Alcohol use: No  . Drug use: No  . Sexual activity: Not on file  Other Topics Concern  . Not on file    Social History Narrative  . Not on file   Social Determinants of Health   Financial Resource Strain:   . Difficulty of Paying Living Expenses:   Food Insecurity:   . Worried About Programme researcher, broadcasting/film/video in the Last Year:   . Barista in the Last Year:   Transportation Needs:   . Freight forwarder (Medical):   Marland Kitchen Lack of Transportation (Non-Medical):   Physical Activity:   . Days of Exercise per Week:   . Minutes of Exercise per Session:   Stress:   . Feeling of Stress :   Social Connections:   . Frequency of Communication with Friends and Family:   . Frequency of Social Gatherings with Friends and Family:   . Attends Religious Services:   . Active Member of Clubs or Organizations:   . Attends Banker Meetings:   Marland Kitchen Marital Status:   Intimate Partner Violence:   . Fear of Current or Ex-Partner:   . Emotionally Abused:   Marland Kitchen Physically Abused:   . Sexually Abused:     Past Surgical History:  Procedure Laterality Date  . HERNIA REPAIR      Family History  Problem Relation Age of Onset  . Hyperlipidemia Mother   . Hyperlipidemia Father   . Hypertension Father   . Colon cancer Father 69  .  Cancer Maternal Grandmother   . Hearing loss Maternal Grandmother   . Diabetes Maternal Grandfather   . Heart disease Maternal Grandfather   . Ovarian cancer Paternal Grandmother   . Hyperlipidemia Paternal Grandmother   . Hypertension Paternal Grandmother   . Depression Paternal Grandmother   . Arthritis Paternal Grandfather   . Prostate cancer Paternal Grandfather   . COPD Paternal Grandfather   . Heart disease Paternal Grandfather   . Hyperlipidemia Paternal Grandfather   . Hypertension Paternal Grandfather   . Stroke Paternal Grandfather     No Known Allergies  Current Outpatient Medications on File Prior to Visit  Medication Sig Dispense Refill  . cetirizine (ZYRTEC) 10 MG tablet Take 10 mg by mouth daily.    . fluticasone (FLONASE) 50 MCG/ACT  nasal spray Place into both nostrils daily.    . polyethylene glycol (MIRALAX / GLYCOLAX) 17 g packet Take 17 g by mouth as needed.     No current facility-administered medications on file prior to visit.    BP 122/80   Pulse 80   Temp (!) 96.4 F (35.8 C) (Temporal)   Ht 5' 8.5" (1.74 m)   Wt 152 lb 8 oz (69.2 kg)   SpO2 98%   BMI 22.85 kg/m    Objective:   Physical Exam  Constitutional: He appears well-nourished.  Respiratory: Effort normal.  Skin: Skin is warm and dry. There is erythema.  2 small, nearly healed spots to right medial ankle. Larger more irritated spot to left lower thoracic back, slightly raised without erythemas migrans.            Assessment & Plan:

## 2020-01-28 NOTE — Patient Instructions (Signed)
Monitor your symptoms. Consider getting Covid tested if no improvement.  Watch out for a bulls eye rash or no improvement in the tick bite site.  It was a pleasure meeting you!   Tick Bite Information, Adult  Ticks are insects that draw blood for food. Most ticks live in shrubs and grassy areas. They climb onto people and animals that brush against the leaves and grasses that they rest on. Then they bite, attaching themselves to the skin. Most ticks are harmless, but some ticks carry germs that can spread to a person through a bite and cause a disease. To reduce your risk of getting a disease from a tick bite, it is important to take steps to prevent tick bites. It is also important to check for ticks after being outdoors. If you find that a tick has attached to you, watch for symptoms of disease. How can I prevent tick bites? Take these steps to help prevent tick bites when you are outdoors in an area where ticks are found:  Use insect repellent that has DEET (20% or higher), picaridin, or IR3535 in it. Use it on: ? Skin that is showing. ? The top of your boots. ? Your pant legs. ? Your sleeve cuffs.  For repellent products that contain permethrin, follow product instructions. Use these products on: ? Clothing. ? Gear. ? Boots. ? Tents.  Wear protective clothing. Long sleeves and long pants offer the best protection from ticks.  Wear light-colored clothing so you can see ticks more easily.  Tuck your pant legs into your socks.  If you go walking on a trail, stay in the middle of the trail so your skin, hair, and clothing do not touch the bushes.  Avoid walking through areas with long grass.  Check for ticks on your clothing, hair, and skin often while you are outside, and check again before you go inside. Make sure to check the places that ticks attach themselves most often. These places include the scalp, neck, armpits, waist, groin, and joint areas. Ticks that carry a disease  called Lyme disease have to be attached to the skin for 24-48 hours. Checking for ticks every day will lessen your risk of this and other diseases.  When you come indoors, wash your clothes and take a shower or a bath right away. Dry your clothes in a dryer on high heat for at least 60 minutes. This will kill any ticks in your clothes. What is the proper way to remove a tick? If you find a tick on your body, remove it as soon as possible. Removing a tick sooner rather than later can prevent germs from passing from the tick to your body. To remove a tick that is crawling on your skin but has not bitten:  Go outdoors and brush the tick off.  Remove the tick with tape or a lint roller. To remove a tick that is attached to your skin:  Wash your hands.  If you have latex gloves, put them on.  Use tweezers, curved forceps, or a tick-removal tool to gently grasp the tick as close to your skin and the tick's head as possible.  Gently pull with steady, upward pressure until the tick lets go. When removing the tick: ? Take care to keep the tick's head attached to its body. ? Do not twist or jerk the tick. This can make the tick's head or mouth break off. ? Do not squeeze or crush the tick's body. This could force  disease-carrying fluids from the tick into your body. Do not try to remove a tick with heat, alcohol, petroleum jelly, or fingernail polish. Using these methods can cause the tick to salivate and regurgitate into your bloodstream, increasing your risk of getting a disease. What should I do after removing a tick?  Clean the bite area with soap and water, rubbing alcohol, or an iodine scrub.  If an antiseptic cream or ointment is available, apply a small amount to the bite site.  Wash and disinfect any instruments that you used to remove the tick. How should I dispose of a tick? To dispose of a live tick, use one of these methods:  Place it in rubbing alcohol.  Place it in a sealed bag  or container.  Wrap it tightly in tape.  Flush it down the toilet. Contact a health care provider if:  You have symptoms of a disease after a tick bite. Symptoms of a tick-borne disease can occur from moments after the tick bites to up to 30 days after a tick is removed. Symptoms include: ? Muscle, joint, or bone pain. ? Difficulty walking or moving your legs. ? Numbness in the legs. ? Paralysis. ? Red rash around the tick bite area that is shaped like a target or a "bull's-eye." ? Redness and swelling in the area of the tick bite. ? Fever. ? Repeated vomiting. ? Diarrhea. ? Weight loss. ? Tender, swollen lymph glands. ? Shortness of breath. ? Cough. ? Pain in the abdomen. ? Headache. ? Abnormal tiredness. ? A change in your level of consciousness. ? Confusion. Get help right away if:  You are not able to remove a tick.  A part of a tick breaks off and gets stuck in your skin.  Your symptoms get worse. Summary  Ticks may carry germs that can spread to a person through a bite and cause disease.  Wear protective clothing and use insect repellent to prevent tick bites. Follow product instructions.  If you find a tick on your body, remove it as soon as possible. If the tick is attached, do not try to remove with heat, alcohol, petroleum jelly, or fingernail polish.  Remove the attached tick using tweezers, curved forceps, or a tick-removal tool. Gently pull with steady, upward pressure until the tick lets go. Do not twist or jerk the tick. Do not squeeze or crush the tick's body.  If you have symptoms after being bitten by a tick, contact a health care provider. This information is not intended to replace advice given to you by your health care provider. Make sure you discuss any questions you have with your health care provider. Document Revised: 08/02/2017 Document Reviewed: 06/01/2016 Elsevier Patient Education  2020 [employer].

## 2020-10-11 DIAGNOSIS — Z20822 Contact with and (suspected) exposure to covid-19: Secondary | ICD-10-CM | POA: Diagnosis not present

## 2020-10-25 NOTE — Progress Notes (Deleted)
Pt scheduled to complete physical 11/01/20.  CL,RMA

## 2020-11-10 NOTE — Progress Notes (Signed)
Scheduled to complete physical 11/17/20 with Ron Smith, PA-C.  AMD 

## 2020-11-11 ENCOUNTER — Other Ambulatory Visit: Payer: Self-pay

## 2020-11-11 ENCOUNTER — Ambulatory Visit: Payer: Self-pay

## 2020-11-11 DIAGNOSIS — Z01818 Encounter for other preprocedural examination: Secondary | ICD-10-CM

## 2020-11-11 LAB — POCT URINALYSIS DIPSTICK
Bilirubin, UA: NEGATIVE
Blood, UA: NEGATIVE
Glucose, UA: NEGATIVE
Ketones, UA: NEGATIVE
Leukocytes, UA: NEGATIVE
Nitrite, UA: NEGATIVE
Protein, UA: NEGATIVE
Spec Grav, UA: <=1.005 — AB (ref 1.010–1.025)
Urobilinogen, UA: 0.2 E.U./dL
pH, UA: 6.5 (ref 5.0–8.0)

## 2020-11-12 LAB — CMP12+LP+TP+TSH+6AC+CBC/D/PLT
ALT: 15 IU/L (ref 0–44)
AST: 21 IU/L (ref 0–40)
Albumin/Globulin Ratio: 2 (ref 1.2–2.2)
Albumin: 4.9 g/dL (ref 4.1–5.2)
Alkaline Phosphatase: 68 IU/L (ref 44–121)
BUN/Creatinine Ratio: 9 (ref 9–20)
BUN: 10 mg/dL (ref 6–20)
Basophils Absolute: 0 10*3/uL (ref 0.0–0.2)
Basos: 1 %
Bilirubin Total: 0.7 mg/dL (ref 0.0–1.2)
Calcium: 9.5 mg/dL (ref 8.7–10.2)
Chloride: 101 mmol/L (ref 96–106)
Chol/HDL Ratio: 3 ratio (ref 0.0–5.0)
Cholesterol, Total: 160 mg/dL (ref 100–199)
Creatinine, Ser: 1.16 mg/dL (ref 0.76–1.27)
EOS (ABSOLUTE): 0.1 10*3/uL (ref 0.0–0.4)
Eos: 3 %
Estimated CHD Risk: <0.5 times avg. (ref 0.0–1.0)
Free Thyroxine Index: 2.2 (ref 1.2–4.9)
GGT: 15 IU/L (ref 0–65)
Globulin, Total: 2.4 g/dL (ref 1.5–4.5)
Glucose: 91 mg/dL (ref 65–99)
HDL: 53 mg/dL (ref >39)
Hematocrit: 44 % (ref 37.5–51.0)
Hemoglobin: 14.6 g/dL (ref 13.0–17.7)
Immature Grans (Abs): 0 10*3/uL (ref 0.0–0.1)
Immature Granulocytes: 0 %
Iron: 131 ug/dL (ref 38–169)
LDH: 142 IU/L (ref 121–224)
LDL Chol Calc (NIH): 95 mg/dL (ref 0–99)
Lymphocytes Absolute: 1.5 10*3/uL (ref 0.7–3.1)
Lymphs: 35 %
MCH: 27.8 pg (ref 26.6–33.0)
MCHC: 33.2 g/dL (ref 31.5–35.7)
MCV: 84 fL (ref 79–97)
Monocytes Absolute: 0.5 10*3/uL (ref 0.1–0.9)
Monocytes: 11 %
Neutrophils Absolute: 2.2 10*3/uL (ref 1.4–7.0)
Neutrophils: 50 %
Phosphorus: 3.4 mg/dL (ref 2.8–4.1)
Platelets: 233 10*3/uL (ref 150–450)
Potassium: 4 mmol/L (ref 3.5–5.2)
RBC: 5.25 x10E6/uL (ref 4.14–5.80)
RDW: 12.4 % (ref 11.6–15.4)
Sodium: 138 mmol/L (ref 134–144)
T3 Uptake Ratio: 29 % (ref 24–39)
T4, Total: 7.6 ug/dL (ref 4.5–12.0)
TSH: 1.52 u[IU]/mL (ref 0.450–4.500)
Total Protein: 7.3 g/dL (ref 6.0–8.5)
Triglycerides: 60 mg/dL (ref 0–149)
Uric Acid: 6.6 mg/dL (ref 3.8–8.4)
VLDL Cholesterol Cal: 12 mg/dL (ref 5–40)
WBC: 4.3 10*3/uL (ref 3.4–10.8)
eGFR: 90 mL/min/{1.73_m2} (ref >59)

## 2020-11-17 ENCOUNTER — Encounter: Payer: Self-pay | Admitting: Physician Assistant

## 2020-11-17 ENCOUNTER — Ambulatory Visit: Payer: Self-pay | Admitting: Physician Assistant

## 2020-11-17 ENCOUNTER — Other Ambulatory Visit: Payer: Self-pay

## 2020-11-17 VITALS — BP 136/88 | HR 70 | Temp 97.9°F | Resp 14 | Ht 69.0 in | Wt 155.0 lb

## 2020-11-17 DIAGNOSIS — Z0000 Encounter for general adult medical examination without abnormal findings: Secondary | ICD-10-CM

## 2020-11-17 NOTE — Progress Notes (Signed)
   Subjective: Annual firefighter exam    Patient ID: Kirk Davis, male    DOB: Feb 19, 1996, 25 y.o.   MRN: [id number]  HPI Patient presents for annual firefighter exam.  No concerns or complaints.   Review of Systems Negative   Objective:   Physical Exam No acute distress BP is 136/88, pulse 70, respiration 14, temperature 97.9, and patient is 90% O2 sat on room air. HEENT is unremarkable.  Neck is supple for adenopathy or bruits.  Lungs are clear to auscultation.  Heart regular rate and rhythm.  Abdomen with negative HSM, normoactive bowel sounds, soft, nontender to palpation.  No obvious deformity to the upper or lower extremities.  Patient has full and equal range of motion of the upper and lower extremities.  No obvious deformity to cervical lumbar spine.  Patient has full and equal range of motion of the cervical lumbar spine.  Cranial nerves II through XII grossly intact.       Assessment & Plan: Well exam.  Discussed no acute findings on labs.  Patient advised to follow-up as needed.

## 2021-01-17 DIAGNOSIS — Z20822 Contact with and (suspected) exposure to covid-19: Secondary | ICD-10-CM | POA: Diagnosis not present

## 2021-08-23 ENCOUNTER — Encounter: Payer: Self-pay | Admitting: Family Medicine

## 2021-08-23 ENCOUNTER — Other Ambulatory Visit: Payer: Self-pay

## 2021-08-23 ENCOUNTER — Ambulatory Visit (INDEPENDENT_AMBULATORY_CARE_PROVIDER_SITE_OTHER): Payer: 59 | Admitting: Family Medicine

## 2021-08-23 VITALS — BP 102/60 | HR 76 | Temp 100.4°F | Ht 69.0 in | Wt 155.1 lb

## 2021-08-23 DIAGNOSIS — J101 Influenza due to other identified influenza virus with other respiratory manifestations: Secondary | ICD-10-CM

## 2021-08-23 DIAGNOSIS — J020 Streptococcal pharyngitis: Secondary | ICD-10-CM | POA: Diagnosis not present

## 2021-08-23 DIAGNOSIS — R051 Acute cough: Secondary | ICD-10-CM | POA: Diagnosis not present

## 2021-08-23 DIAGNOSIS — J029 Acute pharyngitis, unspecified: Secondary | ICD-10-CM | POA: Diagnosis not present

## 2021-08-23 LAB — POC INFLUENZA A&B (BINAX/QUICKVUE)
Influenza A, POC: POSITIVE — AB
Influenza B, POC: NEGATIVE

## 2021-08-23 LAB — POC COVID19 BINAXNOW: SARS Coronavirus 2 Ag: NEGATIVE

## 2021-08-23 LAB — POCT RAPID STREP A (OFFICE): Rapid Strep A Screen: POSITIVE — AB

## 2021-08-23 MED ORDER — AMOXICILLIN 875 MG PO TABS: 875.0000 mg | ORAL_TABLET | Freq: Two times a day (BID) | ORAL | 0 refills | Status: DC

## 2021-08-23 NOTE — Progress Notes (Signed)
Kirk Davis T. Kirk Giambra, MD, CAQ Sports Medicine [facility] [address] [place], [address]  Phone: [phone]   FAX: [phone]  Kirk Davis - 25 y.o. male   MRN [id number]   Date of Birth: 06-11-1996  Date: 08/23/2021   PCP: Lorre Munroe, NP   Referral: Lorre Munroe, NP  Chief Complaint  Patient presents with   Sore Throat    Symptoms started on [facility] [place] [facility] Covid test yesterday   Cough   Generalized Body Aches   Nasal Congestion        Headache   Chills    This visit occurred during the SARS-CoV-2 public health emergency.  Safety protocols were in place, including screening questions prior to the visit, additional usage of staff PPE, and extensive cleaning of exam room while observing appropriate contact time as indicated for disinfecting solutions.   Subjective:   Kirk Davis is a 25 y.o. very pleasant male patient with Body mass index is 22.91 kg/m. who presents with the following:  He presents with an acute illness.  He is having some headache, cough, and fatigue.  He started to feel sick, and on Monday he developed some generalized body aches, polyarthralgias, nasal congestion, cough, headache, chills, and he also has a fever.  His current fever is 100.4, and he felt like he has had a fever for the last few days as well.  He also has a significant sore throat.  Review of Systems is noted in the HPI, as appropriate  Objective:   BP 102/60    Pulse 76    Temp (!) 100.4 F (38 C) (Temporal)    Ht 5\' 9"  (1.753 m)    Wt 155 lb 2 oz (70.4 kg)    SpO2 97%    BMI 22.91 kg/m    Gen: WDWN, cooperative. Globally Non-toxic HEENT: Normocephalic and atraumatic. Throat clear, w/o exudate, R TM clear, L TM - good landmarks, No fluid present. rhinnorhea. No frontal or maxillary sinus T. MMM NECK: Anterior cervical  LAD is present CV: RRR, No M/G/R, cap refill <2 sec PULM: Breathing comfortably in no respiratory  distress. no wheezing, crackles, rhonchi ABD: S,NT,ND,+BS. No HSM. No rebound. MSK: Nml gait   Laboratory and Imaging Data: Results for orders placed or performed in visit on 08/23/21  POC Influenza A&B (Binax test)  Result Value Ref Range   Influenza A, POC Positive (A) Negative   Influenza B, POC Negative Negative  POCT rapid strep A  Result Value Ref Range   Rapid Strep A Screen Positive (A) Negative  POC COVID-19  Result Value Ref Range   SARS Coronavirus 2 Ag Negative Negative     Assessment and Plan:     ICD-10-CM   1. Influenza A  J10.1     2. Sore throat  J02.9 POCT rapid strep A    3. Acute cough  R05.1 POC Influenza A&B (Binax test)    POC COVID-19    4. Strep throat  J02.0      Unfortunately, he does have both influenza and strep at the same time.  His symptoms have been going on greater than 48 hours, so i am gonna place him on some amoxicillin.  Out of work tomorrow, return to work on Sunday.  We reviewed precautions and that he is contagious while he has a fever.  Meds ordered this encounter  Medications   amoxicillin (AMOXIL) 875 MG tablet  Sig: Take 1 tablet (875 mg total) by mouth 2 (two) times daily.    Dispense:  20 tablet    Refill:  0   There are no discontinued medications. Orders Placed This Encounter  Procedures   POC Influenza A&B (Binax test)   POCT rapid strep A   POC COVID-19    Follow-up: No follow-ups on file.  Dragon [employer] One speech-to-text software was used for transcription in this dictation.  Possible transcriptional errors can occur using Animal nutritionist.   Signed,  Elpidio Galea. Koleen Celia, MD   Outpatient Encounter Medications as of 08/23/2021  Medication Sig   amoxicillin (AMOXIL) 875 MG tablet Take 1 tablet (875 mg total) by mouth 2 (two) times daily.   cetirizine (ZYRTEC) 10 MG tablet Take 10 mg by mouth daily.   fluticasone (FLONASE) 50 MCG/ACT nasal spray Place into both nostrils daily.   polyethylene glycol  (MIRALAX / GLYCOLAX) 17 g packet Take 17 g by mouth as needed.   No facility-administered encounter medications on file as of 08/23/2021.

## 2021-10-06 ENCOUNTER — Ambulatory Visit: Payer: Self-pay

## 2021-10-06 ENCOUNTER — Other Ambulatory Visit: Payer: Self-pay

## 2021-10-06 DIAGNOSIS — Z0000 Encounter for general adult medical examination without abnormal findings: Secondary | ICD-10-CM

## 2021-10-06 LAB — POCT URINALYSIS DIPSTICK
Bilirubin, UA: NEGATIVE
Blood, UA: NEGATIVE
Glucose, UA: NEGATIVE
Ketones, UA: NEGATIVE
Leukocytes, UA: NEGATIVE
Nitrite, UA: NEGATIVE
Protein, UA: NEGATIVE
Spec Grav, UA: 1.01 (ref 1.010–1.025)
Urobilinogen, UA: 0.2 E.U./dL
pH, UA: 6 (ref 5.0–8.0)

## 2021-10-06 NOTE — Progress Notes (Signed)
10/10/21 annual physical scheduled

## 2021-10-07 LAB — CMP12+LP+TP+TSH+6AC+CBC/D/PLT
ALT: 14 IU/L (ref 0–44)
AST: 21 IU/L (ref 0–40)
Albumin/Globulin Ratio: 2.3 — ABNORMAL HIGH (ref 1.2–2.2)
Albumin: 4.8 g/dL (ref 4.1–5.2)
Alkaline Phosphatase: 64 IU/L (ref 44–121)
BUN/Creatinine Ratio: 12 (ref 9–20)
BUN: 14 mg/dL (ref 6–20)
Basophils Absolute: 0 10*3/uL (ref 0.0–0.2)
Basos: 1 %
Bilirubin Total: 0.5 mg/dL (ref 0.0–1.2)
Calcium: 9.5 mg/dL (ref 8.7–10.2)
Chloride: 101 mmol/L (ref 96–106)
Chol/HDL Ratio: 3.5 ratio (ref 0.0–5.0)
Cholesterol, Total: 161 mg/dL (ref 100–199)
Creatinine, Ser: 1.18 mg/dL (ref 0.76–1.27)
EOS (ABSOLUTE): 0.1 10*3/uL (ref 0.0–0.4)
Eos: 2 %
Estimated CHD Risk: 0.6 times avg. (ref 0.0–1.0)
Free Thyroxine Index: 1.9 (ref 1.2–4.9)
GGT: 15 IU/L (ref 0–65)
Globulin, Total: 2.1 g/dL (ref 1.5–4.5)
Glucose: 84 mg/dL (ref 70–99)
HDL: 46 mg/dL (ref >39)
Hematocrit: 42.2 % (ref 37.5–51.0)
Hemoglobin: 13.7 g/dL (ref 13.0–17.7)
Immature Grans (Abs): 0 10*3/uL (ref 0.0–0.1)
Immature Granulocytes: 0 %
Iron: 133 ug/dL (ref 38–169)
LDH: 146 IU/L (ref 121–224)
LDL Chol Calc (NIH): 99 mg/dL (ref 0–99)
Lymphocytes Absolute: 1.4 10*3/uL (ref 0.7–3.1)
Lymphs: 28 %
MCH: 27.8 pg (ref 26.6–33.0)
MCHC: 32.5 g/dL (ref 31.5–35.7)
MCV: 86 fL (ref 79–97)
Monocytes Absolute: 0.5 10*3/uL (ref 0.1–0.9)
Monocytes: 10 %
Neutrophils Absolute: 3 10*3/uL (ref 1.4–7.0)
Neutrophils: 59 %
Phosphorus: 3.5 mg/dL (ref 2.8–4.1)
Platelets: 221 10*3/uL (ref 150–450)
Potassium: 3.9 mmol/L (ref 3.5–5.2)
RBC: 4.92 x10E6/uL (ref 4.14–5.80)
RDW: 12.7 % (ref 11.6–15.4)
Sodium: 137 mmol/L (ref 134–144)
T3 Uptake Ratio: 27 % (ref 24–39)
T4, Total: 6.9 ug/dL (ref 4.5–12.0)
TSH: 1.82 u[IU]/mL (ref 0.450–4.500)
Total Protein: 6.9 g/dL (ref 6.0–8.5)
Triglycerides: 83 mg/dL (ref 0–149)
Uric Acid: 6.2 mg/dL (ref 3.8–8.4)
VLDL Cholesterol Cal: 16 mg/dL (ref 5–40)
WBC: 5.1 10*3/uL (ref 3.4–10.8)
eGFR: 88 mL/min/{1.73_m2} (ref >59)

## 2021-10-10 ENCOUNTER — Ambulatory Visit: Payer: Self-pay | Admitting: Physician Assistant

## 2021-10-10 ENCOUNTER — Encounter: Payer: Self-pay | Admitting: Physician Assistant

## 2021-10-10 ENCOUNTER — Other Ambulatory Visit: Payer: Self-pay

## 2021-10-10 VITALS — BP 133/78 | HR 71 | Temp 98.6°F | Resp 14 | Ht 69.0 in | Wt 155.0 lb

## 2021-10-10 DIAGNOSIS — Z0000 Encounter for general adult medical examination without abnormal findings: Secondary | ICD-10-CM

## 2021-10-10 NOTE — Progress Notes (Signed)
[facility]    ____________________________________________   None    (approximate)  I have reviewed the triage vital signs and the nursing notes.   HISTORY  Chief Complaint No chief complaint on file.    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  Patient feels complaints.         History reviewed. No pertinent past medical history.  Patient Active Problem List   Diagnosis Date Noted   Arthropod bite of left side of back 01/28/2020   Internal hemorrhoid, bleeding 03/04/2019   Family hx of colon cancer 03/04/2019    Past Surgical History:  Procedure Laterality Date   HERNIA REPAIR      Prior to Admission medications   Medication Sig Start Date End Date Taking? Authorizing Provider  cetirizine (ZYRTEC) 10 MG tablet Take 10 mg by mouth daily.   Yes [provider]  fluticasone (FLONASE) 50 MCG/ACT nasal spray Place into both nostrils daily.   Yes [provider]  polyethylene glycol (MIRALAX / GLYCOLAX) 17 g packet Take 17 g by mouth as needed.   Yes [provider]    Allergies Patient has no known allergies.  Family History  Problem Relation Age of Onset   Hyperlipidemia Mother    Hyperlipidemia Father    Hypertension Father    Colon cancer Father 2   Cancer Maternal Grandmother    Hearing loss Maternal Grandmother    Diabetes Maternal Grandfather    Heart disease Maternal Grandfather    Ovarian cancer Paternal Grandmother    Hyperlipidemia Paternal Grandmother    Hypertension Paternal Grandmother    Depression Paternal Grandmother    Arthritis Paternal Grandfather    Prostate cancer Paternal Grandfather    COPD Paternal Grandfather    Heart disease Paternal Grandfather    Hyperlipidemia Paternal Grandfather    Hypertension Paternal Grandfather    Stroke Paternal Grandfather     Social History Social History   Tobacco Use   Smoking status: Never   Smokeless  tobacco: Never  Vaping Use   Vaping Use: Never used  Substance Use Topics   Alcohol use: No   Drug use: No    Review of Systems Constitutional: No fever/chills Eyes: No visual changes. ENT: No sore throat. Cardiovascular: Denies chest pain. Respiratory: Denies shortness of breath. Gastrointestinal: No abdominal pain.  No nausea, no vomiting.  No diarrhea.  No constipation. Genitourinary: Negative for dysuria. Musculoskeletal: Negative for back pain. Skin: Negative for rash. Neurological: Negative for headaches, focal weakness or numbness.   ____________________________________________   PHYSICAL EXAM:  VITAL SIGNS: Temperature 98.6, pulse 71, respiration 14, BP is 133/70, and patient is 1% O2 sat on room air.  Patient was on 55 pounds and BMI is 22.89. Constitutional: Alert and oriented. Well appearing and in no acute distress. Eyes: Conjunctivae are normal. PERRL. EOMI. Head: Atraumatic. Nose: No congestion/rhinnorhea. Mouth/Throat: Mucous membranes are moist.  Oropharynx non-erythematous. Neck: No stridor.  No cervical spine tenderness to palpation. Hematological/Lymphatic/Immunilogical: No cervical lymphadenopathy. Cardiovascular: Normal rate, regular rhythm. Grossly normal heart sounds.  Good peripheral circulation. Respiratory: Normal respiratory effort.  No retractions. Lungs CTAB. Gastrointestinal: Soft and nontender. No distention. No abdominal bruits. No CVA tenderness. Genitourinary: Deferred Musculoskeletal: No lower extremity tenderness nor edema.  No joint effusions. Neurologic:  Normal speech and language. No gross focal neurologic deficits are appreciated. No gait instability. Skin:  Skin is warm, dry and intact. No rash noted. Psychiatric: Mood  and affect are normal. Speech and behavior are normal.  ____________________________________________   LABS            Component Ref Range & Units 4 d ago (10/06/21) 11 mo ago (11/11/20) 1 yr ago (10/12/19) 2 yr  ago (06/24/19) 2 yr ago (06/18/19) 2 yr ago (04/22/19) 2 yr ago (01/15/19)  Color, UA  Yellow  yellow  yellow  Yellow  Yellow  Yellow  yellow   Clarity, UA  Clear  clear  clear  clear  Clear  Clear  clear   Glucose, UA Negative Negative  Negative  Negative  Negative  Negative  Negative  Negative   Bilirubin, UA  Negative  negative  negative  neg  Negative  Negative  neg   Ketones, UA  Negative  negative  negative  neg  Negative  Negative  neg   Spec Grav, UA 1.010 - 1.025 1.010  <=1.005 Abnormal   1.020  1.020  1.020  1.010  1.025   Blood, UA  Negative  negative  negative  neg  Negative  Negative  small   pH, UA 5.0 - 8.0 6.0  6.5  6.0  6.5  6.0  7.0  6.0   Protein, UA Negative Negative  Negative  Negative  Negative  Negative  Negative  Negative   Urobilinogen, UA 0.2 or 1.0 E.U./dL 0.2  0.2  0.2  0.2  0.2  0.2  0.2   Nitrite, UA  Negative  negative  negative  neg  Negative  Negative  neg   Leukocytes, UA Negative Negative  Negative  Negative  Negative  Negative  Negative  Negative   Appearance  Clear  light            Odor  None                     Specimen Collected: 10/06/21 09:23 Last Resulted: 10/06/21 09:23      Lab Flowsheet    Order Details    View Encounter    Lab and Collection Details    Routing    Result History    View Encounter Conversation        Result Care Coordination   Patient Communication   Add Comments   Not seen Back to Top       Other Results from 10/06/2021   Contains abnormal data CMP12+LP+TP+TSH+6AC+CBC/D/Plt Order: [id number] Status: Final result    Visible to patient: No (inaccessible in MyChart)    Next appt: None    Dx: Annual physical exam    0 Result Notes           Component Ref Range & Units 4 d ago (10/06/21) 11 mo ago (11/11/20) 1 yr ago (10/12/19) 2 yr ago (06/18/19) 2 yr ago (04/22/19) 6 yr ago (05/05/15) 6 yr ago (05/05/15)  Glucose 70 - 99 mg/dL 84  91 R  90 R  79 R  71 R  87 R    Uric Acid 3.8 - 8.4 mg/dL 6.2  6.6 CM  5.8 CM   5.7 R, CM  5.8 R, CM     Comment:            Therapeutic target for gout patients: <6.0  BUN 6 - 20 mg/dL _0 Creatinine, Ser 0.76 - 1.27 mg/dL 1.18  1.16  1.07  1.05  1.09  1.03 R  eGFR >59 mL/min/1.73 88  90        BUN/Creatinine Ratio 9 - _0 Sodium 134 - 144 mmol/L 137  138  141  141  141  139 R    Potassium 3.5 - 5.2 mmol/L 3.9  4.0  4.3  4.7  4.4  4.2 R    Chloride 96 - 106 mmol/L 101  101  103  102  100  102 R    Calcium 8.7 - 10.2 mg/dL 9.5  9.5  9.3  9.7  10.1  9.4 R    Phosphorus 2.8 - 4.1 mg/dL 3.5  3.4  3.2  4.1  3.1     Total Protein 6.0 - 8.5 g/dL 6.9  7.3  7.0  7.3  7.2  7.4 R    Albumin 4.1 - 5.2 g/dL 4.8  4.9  4.2  4.9  4.9  4.3 R    Globulin, Total 1.5 - 4.5 g/dL 2.1  2.4  2.8  2.4  2.3     Albumin/Globulin Ratio 1.2 - 2.2 2.3 High   2.0  1.5  2.0  2.1     Bilirubin Total 0.0 - 1.2 mg/dL 0.5  0.7  0.4  0.4  0.5  0.8 R    Alkaline Phosphatase 44 - 121 IU/L 64  68  75 R  76 R  74 R  80 R    LDH 121 - 224 IU/L 146  142  156  146  164     AST 0 - 40 IU/L _1 73 High  R    ALT 0 - 44 IU/L _2 46 R    GGT 0 - 65 IU/L _3 Iron 38 - 169 ug/dL 133  131  70  98  106     Cholesterol, Total 100 - 199 mg/dL 161  160  152  144  156     Triglycerides 0 - 149 mg/dL 83  60  70  78  75     HDL >39 mg/dL 46  53  49  46  48     VLDL Cholesterol Cal 5 - 40 mg/dL _4 LDL Chol Calc (NIH) 0 - 99 mg/dL 99  95  89  83      Chol/HDL Ratio 0.0 - 5.0 ratio 3.5  3.0 CM  3.1 CM  3.1 CM  3.3 CM     Comment:                                   T. Chol/HDL Ratio                                              Men  Women                                1/2 Avg.Risk  3.4    3.3  Avg.Risk  5.0    4.4                                 2X Avg.Risk  9.6    7.1                                 3X Avg.Risk 23.4   11.0   Estimated CHD Risk 0.0 - 1.0 times avg. 0.6   <  0.5 CM   < 0.5 CM   < 0.5 CM   < 0.5 CM     Comment: The CHD Risk is based on the T. Chol/HDL ratio. Other  factors affect CHD Risk such as hypertension, smoking,  diabetes, severe obesity, and family history of  premature CHD.   TSH 0.450 - 4.500 uIU/mL 1.820  1.520  1.240  2.330  1.010     T4, Total 4.5 - 12.0 ug/dL 6.9  7.6  7.0  7.2  7.3     T3 Uptake Ratio 24 - 39 % _0 Free Thyroxine Index 1.2 - 4.9 1.9  2.2  2.1  2.0  2.1     WBC 3.4 - 10.8 x10E3/uL 5.1  4.3  4.5  4.7  4.5   9.2 R   RBC 4.14 - 5.80 x10E6/uL 4.92  5.25  5.45  5.22  5.37   4.81 R   Hemoglobin 13.0 - 17.7 g/dL 13.7  14.6  15.4  14.3  15.1   13.2 R   Hematocrit 37.5 - 51.0 % 42.2  44.0  46.5  44.1  44.3   40.6 R   MCV 79 - 97 fL 86  84  85  85  83   84.4 R   MCH 26.6 - 33.0 pg 27.8  27.8  28.3  27.4  28.1   27.4 R   MCHC 31.5 - 35.7 g/dL 32.5  33.2  33.1  32.4  34.1   32.5 R   RDW 11.6 - 15.4 % 12.7  12.4  12.6  12.1  11.8   13.4 R   Platelets 150 - 450 x10E3/uL 221  233  253  253  260   277 R   Neutrophils Not Estab. % 59  50  55  55  54   64 R   Lymphs Not Estab. % 28  35  _1 Monocytes Not Estab. % _2 Eos Not Estab. % _3 Basos Not Estab. % _4 Neutrophils Absolute 1.4 - 7.0 x10E3/uL 3.0  2.2  2.5  2.6  2.4   6.0 R   Lymphocytes Absolute 0.7 - 3.1 x10E3/uL 1.4  1.5  1.4  1.5  1.4   2.0 R   Monocytes Absolute 0.1 - 0.9 x10E3/uL 0.5  0.5  0.4  0.5  0.5     EOS (ABSOLUTE) 0.0 - 0.4 x10E3/uL 0.1  0.1  0.1  0.1  0.1     Basophils Absolute 0.0 - 0.2 x10E3/uL 0.0  0.0  0.1  0.0  0.1   0.1 R   Immature  Granulocytes Not Estab. % 0  0  0  0  0     Immature Grans              ____________________________________________  EKG  Sinus  Rhythm At 69 bpm. -RSR(V1) -probably normal for age.   -Anterolateral ST-elevation -repolarization variant.   PROBABLY NORMAL FOR  AGE ____________________________________________   ____________________________________________   INITIAL IMPRESSION / ASSESSMENT AND PLAN  As part of my medical decision making, I reviewed the following data within the electronic MEDICAL RECORD NUMBER Notes from prior ED visits and [place] Controlled Substance Database  Discussed lab and EKG results with patient.           ____________________________________________   FINAL CLINICAL IMPRESSION(S)   Well exam.   ED Discharge Orders     None        Note:  This document was prepared using Dragon voice recognition software and may include unintentional dictation errors.

## 2021-10-10 NOTE — Progress Notes (Signed)
Pt denies any issues or concerns at this time/CL,RMA ?

## 2022-02-27 ENCOUNTER — Ambulatory Visit
Admission: RE | Admit: 2022-02-27 | Discharge: 2022-02-27 | Disposition: A | Discharge status: Home / Self Care | Payer: No Typology Code available for payment source | Source: Ambulatory Visit | Attending: Nurse Practitioner | Admitting: Nurse Practitioner

## 2022-02-27 ENCOUNTER — Other Ambulatory Visit: Payer: Self-pay | Admitting: Nurse Practitioner

## 2022-02-27 DIAGNOSIS — Z021 Encounter for pre-employment examination: Secondary | ICD-10-CM

## 2024-05-07 ENCOUNTER — Encounter: Payer: Self-pay | Admitting: Emergency Medicine

## 2024-05-07 ENCOUNTER — Ambulatory Visit: Payer: Self-pay

## 2024-05-07 ENCOUNTER — Ambulatory Visit
Admission: EM | Admit: 2024-05-07 | Discharge: 2024-05-07 | Disposition: A | Discharge status: Home / Self Care | Attending: Emergency Medicine | Admitting: Emergency Medicine

## 2024-05-07 ENCOUNTER — Ambulatory Visit

## 2024-05-07 DIAGNOSIS — I491 Atrial premature depolarization: Secondary | ICD-10-CM

## 2024-05-07 DIAGNOSIS — R002 Palpitations: Secondary | ICD-10-CM | POA: Diagnosis not present

## 2024-05-07 NOTE — Discharge Instructions (Addendum)
 Your evaluated for your palpitations  EKG able to capture a premature atrial contraction which is an extra beat in the heart rhythm, non emergent, more information in your packet  Blood work has been obtained to check blood levels and electrolytes liver and kidney function, pending you will be notified of any concerning values as alterations in electrolytes can cause changes in the heart rhythm  Avoid energy drinks, caffeine and alcohol  Ensure that you are getting adequate rest  Please schedule follow-up appoint with your primary doctor so they may begin workup to further evaluate your symptoms, you may have also been given information to the cardiologist who is the heart specialist for follow-up  At any point if your symptoms worsen in severity or you begin to experience chest pain, shortness of breath, dizziness lightheadedness or visual disturbance you will need to go to the nearest emergency department for immediate evaluation

## 2024-05-07 NOTE — Telephone Encounter (Signed)
 FYI Only or Action Required?: FYI only for provider.  Patient was last seen in primary care on 08/23/2021 by Kirk Mirza, MD.  Called Nurse Triage reporting Palpitations.  Symptoms began yesterday.  Interventions attempted: Rest, hydration, or home remedies.  Symptoms are: gradually worsening.  Triage Disposition: See HCP Within 4 Hours (Or PCP Triage)  Patient/caregiver understands and will follow disposition?: Yes     Copied from CRM [id number]. Topic: Clinical - Red Word Triage >> May 07, 2024  8:46 AM Rosina BIRCH wrote: Reason for [id number] skipping a beat started last night and it is consistent Reason for Disposition  [1] Skipped or extra beat(s) AND [2] occurs 4 or more times per minute  Answer Assessment - Initial Assessment Questions 1. DESCRIPTION: Please describe your heart rate or heartbeat that you are having (e.g., fast/slow, regular/irregular, skipped or extra beats, palpitations)     Skipping a beat 2. ONSET: When did it start? (e.g., minutes, hours, days)      X 1 year - occurred once a month, worsening/more consistent since last night 3. DURATION: How long does it last (e.g., seconds, minutes, hours)     Every few minutes 4. PATTERN Does it come and go, or has it been constant since it started?  Does it get worse with exertion?   Are you feeling it now?     intermittent 5. TAP: Using your hand, can you tap out what you are feeling on a chair or table in front of you, so that I can hear? Note: Not all patients can do this.       N/a 6. HEART RATE: Can you tell me your heart rate? How many beats in 15 seconds?  Note: Not all patients can do this.       Endorses 80-90 7. RECURRENT SYMPTOM: Have you ever had this before? If Yes, ask: When was the last time? and What happened that time?      Yes, has been happening x 1 year, but self resolves 8. CAUSE: What do you think is causing the palpitations?     unknown 9. CARDIAC HISTORY: Do  you have any history of heart disease? (e.g., heart attack, angina, bypass surgery, angioplasty, arrhythmia)      Denies Endorses hx of diaphragmatic hernia as newborn 10. OTHER SYMPTOMS: Do you have any other symptoms? (e.g., dizziness, chest pain, sweating, difficulty breathing)       denies 11. PREGNANCY: Is there any chance you are pregnant? When was your last menstrual period?       N/a    Scheduled at alternate [facility] PCP office.  Protocols used: Heart Rate and Heartbeat Questions-A-AH

## 2024-05-07 NOTE — Telephone Encounter (Signed)
 Pt was set up appt at [facility] this morning from previous triage but office cancelled. No offices in region had openings. RN advised pt to go to UC this morning. Pt verbalized understanding.

## 2024-05-07 NOTE — ED Triage Notes (Signed)
 Patient reports irregular heart beat that started last night at 7 pm. Denies CP and SOB.

## 2024-05-07 NOTE — ED Provider Notes (Signed)
 Kirk Davis    CSN: [id number] Arrival date & time: 05/07/24  1026      History   Chief Complaint Chief Complaint  Patient presents with   Irregular Heart Beat    HPI Kirk Davis is a 28 y.o. male.   Patient presents for evaluation of increase in heart palpitations beginning 1 day ago.  Feels that the heart is skipping a beat, described as stopping and then heart pumping directly after.  It no prior cardiac history, familial history of hypertension and CVA.  Denies chest pain, shortness of breath, dizziness lightheadedness, visual disturbance, nausea or vomiting.  Has not attempted treatment.  Endorses caffeine intake of approximately 2 cups of coffee daily and 1 soda, denies use of energy drinks.  Denies dietary changes, does not take any daily medicine.  Denies injury to the chest wall.  Socially using alcohol maybe 1 to 2 glasses a week.  Denies smoking of any form.  History reviewed. No pertinent past medical history.  Patient Active Problem List   Diagnosis Date Noted   Arthropod bite of left side of back 01/28/2020   Internal hemorrhoid, bleeding 03/04/2019   Family hx of colon cancer 03/04/2019    Past Surgical History:  Procedure Laterality Date   HERNIA REPAIR         Home Medications    Prior to Admission medications   Medication Sig Start Date End Date Taking? Authorizing Provider  cetirizine (ZYRTEC) 10 MG tablet Take 10 mg by mouth daily.    [provider]  fluticasone (FLONASE) 50 MCG/ACT nasal spray Place into both nostrils daily.    [provider]  polyethylene glycol (MIRALAX  / GLYCOLAX ) 17 g packet Take 17 g by mouth as needed.    [provider]    Family History Family History  Problem Relation Age of Onset   Hyperlipidemia Mother    Hyperlipidemia Father    Hypertension Father    Colon cancer Father 25   Cancer Maternal Grandmother    Hearing loss Maternal Grandmother    Diabetes Maternal  Grandfather    Heart disease Maternal Grandfather    Ovarian cancer Paternal Grandmother    Hyperlipidemia Paternal Grandmother    Hypertension Paternal Grandmother    Depression Paternal Grandmother    Arthritis Paternal Grandfather    Prostate cancer Paternal Grandfather    COPD Paternal Grandfather    Heart disease Paternal Grandfather    Hyperlipidemia Paternal Grandfather    Hypertension Paternal Grandfather    Stroke Paternal Grandfather     Social History Social History   Tobacco Use   Smoking status: Never   Smokeless tobacco: Never  Vaping Use   Vaping status: Never Used  Substance Use Topics   Alcohol use: Yes   Drug use: No     Allergies   Patient has no known allergies.   Review of Systems Review of Systems   Physical Exam Triage Vital Signs ED Triage Vitals  Encounter Vitals Group     BP 05/07/24 1050 (!) 143/88     Girls Systolic BP Percentile --      Girls Diastolic BP Percentile --      Boys Systolic BP Percentile --      Boys Diastolic BP Percentile --      Pulse Rate 05/07/24 1050 82     Resp 05/07/24 1050 18     Temp 05/07/24 1050 98.4 F (36.9 C)     Temp Source 05/07/24 1050  Oral     SpO2 05/07/24 1050 98 %     Weight --      Height --      Head Circumference --      Peak Flow --      Pain Score 05/07/24 1048 0     Pain Loc --      Pain Education --      Exclude from Growth Chart --    No data found.  Updated Vital Signs BP (!) 143/88 (BP Location: Right Arm)   Pulse 82   Temp 98.4 F (36.9 C) (Oral)   Resp 18   SpO2 98%   Visual Acuity Right Eye Distance:   Left Eye Distance:   Bilateral Distance:    Right Eye Near:   Left Eye Near:    Bilateral Near:     Physical Exam Constitutional:      Appearance: Normal appearance.  Eyes:     Extraocular Movements: Extraocular movements intact.  Cardiovascular:     Rate and Rhythm: Normal rate and regular rhythm.     Pulses: Normal pulses.     Heart sounds: Normal heart  sounds.  Pulmonary:     Effort: Pulmonary effort is normal.     Breath sounds: Normal breath sounds.  Neurological:     Mental Status: He is alert and oriented to person, place, and time. Mental status is at baseline.      UC Treatments / Results  Labs (all labs ordered are listed, but only abnormal results are displayed) Labs Reviewed  BASIC METABOLIC PANEL WITH GFR  CBC    EKG   Radiology No results found.  Procedures Procedures (including critical care time)  Medications Ordered in UC Medications - No data to display  Initial Impression / Assessment and Plan / UC Course  I have reviewed the triage vital signs and the nursing notes.  Pertinent labs & imaging results that were available during my care of the patient were reviewed by me and considered in my medical decision making (see chart for details).  PAC, palpitations  Vital signs are within normal range, patient in no signs of distress nontoxic-appearing, able to capture premature atrial contractions on EKG, CMC and CBC pending, will notify of any concerning values and have him follow-up with primary doctor unless values are critical, advised avoiding caffeine and alcohol and ensuring adequate rest, advised PCP follow-up and also given walker referral to cardiology, given strict ER precautions for symptoms worsening in severity Final Clinical Impressions(s) / UC Diagnoses   Final diagnoses:  Palpitations  Premature atrial contraction     Discharge Instructions      Your evaluated for your palpitations  EKG able to capture a premature atrial contraction which is an extra beat in the heart rhythm, non emergent, more information in your packet  Blood work has been obtained to check blood levels and electrolytes liver and kidney function, pending you will be notified of any concerning values as alterations in electrolytes can cause changes in the heart rhythm  Avoid energy drinks, caffeine and  alcohol  Ensure that you are getting adequate rest  Please schedule follow-up appoint with your primary doctor so they may begin workup to further evaluate your symptoms, you may have also been given information to the cardiologist who is the heart specialist for follow-up  At any point if your symptoms worsen in severity or you begin to experience chest pain, shortness of breath, dizziness lightheadedness or visual disturbance you  will need to go to the nearest emergency department for immediate evaluation   ED Prescriptions   None    PDMP not reviewed this encounter.   Teresa Shelba SAUNDERS, NP 05/07/24 1145

## 2024-05-08 ENCOUNTER — Ambulatory Visit (HOSPITAL_COMMUNITY): Payer: Self-pay

## 2024-05-08 LAB — BASIC METABOLIC PANEL WITH GFR
BUN/Creatinine Ratio: 10 (ref 9–20)
BUN: 12 mg/dL (ref 6–20)
CO2: 21 mmol/L (ref 20–29)
Calcium: 9.9 mg/dL (ref 8.7–10.2)
Chloride: 103 mmol/L (ref 96–106)
Creatinine, Ser: 1.18 mg/dL (ref 0.76–1.27)
Glucose: 97 mg/dL (ref 70–99)
Potassium: 4.6 mmol/L (ref 3.5–5.2)
Sodium: 141 mmol/L (ref 134–144)
eGFR: 87 mL/min/1.73 (ref >59)

## 2024-05-08 LAB — CBC
Hematocrit: 47.7 % (ref 37.5–51.0)
Hemoglobin: 15.3 g/dL (ref 13.0–17.7)
MCH: 28.1 pg (ref 26.6–33.0)
MCHC: 32.1 g/dL (ref 31.5–35.7)
MCV: 88 fL (ref 79–97)
Platelets: 252 x10E3/uL (ref 150–450)
RBC: 5.44 x10E6/uL (ref 4.14–5.80)
RDW: 12.2 % (ref 11.6–15.4)
WBC: 5.2 x10E3/uL (ref 3.4–10.8)

## 2024-05-11 ENCOUNTER — Emergency Department (HOSPITAL_COMMUNITY)

## 2024-05-11 ENCOUNTER — Other Ambulatory Visit: Payer: Self-pay

## 2024-05-11 ENCOUNTER — Emergency Department (HOSPITAL_COMMUNITY)
Admission: EM | Admit: 2024-05-11 | Discharge: 2024-05-11 | Disposition: A | Discharge status: Home / Self Care | Attending: Emergency Medicine | Admitting: Emergency Medicine

## 2024-05-11 ENCOUNTER — Encounter (HOSPITAL_COMMUNITY): Payer: Self-pay

## 2024-05-11 DIAGNOSIS — R002 Palpitations: Secondary | ICD-10-CM | POA: Diagnosis present

## 2024-05-11 DIAGNOSIS — I493 Ventricular premature depolarization: Secondary | ICD-10-CM | POA: Insufficient documentation

## 2024-05-11 LAB — CBC
HCT: 46.1 % (ref 39.0–52.0)
Hemoglobin: 15 g/dL (ref 13.0–17.0)
MCH: 27.8 pg (ref 26.0–34.0)
MCHC: 32.5 g/dL (ref 30.0–36.0)
MCV: 85.4 fL (ref 80.0–100.0)
Platelets: 243 K/uL (ref 150–400)
RBC: 5.4 MIL/uL (ref 4.22–5.81)
RDW: 12.4 % (ref 11.5–15.5)
WBC: 4.4 K/uL (ref 4.0–10.5)
nRBC: 0 % (ref 0.0–0.2)

## 2024-05-11 LAB — BASIC METABOLIC PANEL WITH GFR
Anion gap: 11 (ref 5–15)
BUN: 13 mg/dL (ref 6–20)
CO2: 25 mmol/L (ref 22–32)
Calcium: 9.6 mg/dL (ref 8.9–10.3)
Chloride: 103 mmol/L (ref 98–111)
Creatinine, Ser: 1.21 mg/dL (ref 0.61–1.24)
GFR, Estimated: >60 mL/min (ref >60)
Glucose, Bld: 90 mg/dL (ref 70–99)
Potassium: 4 mmol/L (ref 3.5–5.1)
Sodium: 139 mmol/L (ref 135–145)

## 2024-05-11 LAB — TSH: TSH: 1.093 u[IU]/mL (ref 0.350–4.500)

## 2024-05-11 LAB — TROPONIN I (HIGH SENSITIVITY): Troponin I (High Sensitivity): 3 ng/L (ref <18)

## 2024-05-11 LAB — T4, FREE: Free T4: 0.97 ng/dL (ref 0.61–1.12)

## 2024-05-11 LAB — MAGNESIUM: Magnesium: 2 mg/dL (ref 1.7–2.4)

## 2024-05-11 NOTE — ED Triage Notes (Signed)
 C/o irregular heart rate for several years , was seen at [facility] 9/5 , states it is getting more freg. And since fri, feels like it is constant. Denies chest pain

## 2024-05-11 NOTE — ED Provider Notes (Signed)
 [place] EMERGENCY DEPARTMENT AT [facility] Provider Note  CSN: [id number] Arrival date & time: 05/11/24 9062  Chief Complaint(s) Palpitations  HPI Kirk Davis is a 28 y.o. male with past medical history as below, significant for PVC who presents to the ED with complaint of palpitations  Patient here with palpitations, ongoing over the past week.  Seems to worsening.  He was seen in urgent care on 9/4, told he had premature atrial contraction and advised by the cardiology, he has appoint with cardiology later this week.  Reports his frequency of palpitations has increased, he is not having chest pain, no dyspnea or diaphoresis, no syncope or near syncope.  Has cut back on his caffeine.  He has experience the symptoms periodically in the past but seem to be worsening over the past few days  Past Medical History History reviewed. No pertinent past medical history. Patient Active Problem List   Diagnosis Date Noted   Arthropod bite of left side of back 01/28/2020   Internal hemorrhoid, bleeding 03/04/2019   Family hx of colon cancer 03/04/2019   Home Medication(s) Prior to Admission medications   Medication Sig Start Date End Date Taking? Authorizing Provider  cetirizine (ZYRTEC) 10 MG tablet Take 10 mg by mouth daily.    [provider]  fluticasone (FLONASE) 50 MCG/ACT nasal spray Place into both nostrils daily.    [provider]  polyethylene glycol (MIRALAX  / GLYCOLAX ) 17 g packet Take 17 g by mouth as needed.    [provider]                                                                                                                                    Past Surgical History Past Surgical History:  Procedure Laterality Date   HERNIA REPAIR     Family History Family History  Problem Relation Age of Onset   Hyperlipidemia Mother    Hyperlipidemia Father    Hypertension Father    Colon cancer Father 95   Cancer Maternal  Grandmother    Hearing loss Maternal Grandmother    Diabetes Maternal Grandfather    Davis disease Maternal Grandfather    Ovarian cancer Paternal Grandmother    Hyperlipidemia Paternal Grandmother    Hypertension Paternal Grandmother    Depression Paternal Grandmother    Arthritis Paternal Grandfather    Prostate cancer Paternal Grandfather    COPD Paternal Grandfather    Davis disease Paternal Grandfather    Hyperlipidemia Paternal Grandfather    Hypertension Paternal Grandfather    Stroke Paternal Grandfather     Social History Social History   Tobacco Use   Smoking status: Never   Smokeless tobacco: Never  Vaping Use   Vaping status: Never Used  Substance Use Topics   Alcohol use: Yes   Drug use: No   Allergies Patient has no known allergies.  Review of Systems A thorough review of  systems was obtained and all systems are negative except as noted in the HPI and PMH.   Physical Exam Vital Signs  I have reviewed the triage vital signs BP (!) 140/84   Pulse 76   Temp 98.2 F (36.8 C)   Resp 16   Ht 5' 9 (1.753 m)   Wt 72.6 kg   SpO2 100%   BMI 23.63 kg/m  Physical Exam Vitals and nursing note reviewed.  Constitutional:      General: He is not in acute distress.    Appearance: Normal appearance. He is well-developed.  HENT:     Head: Normocephalic and atraumatic.     Right Ear: External ear normal.     Left Ear: External ear normal.     Mouth/Throat:     Mouth: Mucous membranes are moist.  Eyes:     General: No scleral icterus. Cardiovascular:     Rate and Rhythm: Normal rate and regular rhythm.     Pulses: Normal pulses.     Davis sounds: Normal Davis sounds.  Pulmonary:     Effort: Pulmonary effort is normal. No respiratory distress.     Breath sounds: Normal breath sounds.  Abdominal:     General: Abdomen is flat.     Palpations: Abdomen is soft.     Tenderness: There is no abdominal tenderness.  Musculoskeletal:     Cervical back: No  rigidity.     Right lower leg: No edema.     Left lower leg: No edema.  Skin:    General: Skin is warm and dry.     Capillary Refill: Capillary refill takes less than 2 seconds.  Neurological:     Mental Status: He is alert.  Psychiatric:        Mood and Affect: Mood normal.        Behavior: Behavior normal.     ED Results and Treatments Labs (all labs ordered are listed, but only abnormal results are displayed) Labs Reviewed  BASIC METABOLIC PANEL WITH GFR  CBC  TSH  T4, FREE  MAGNESIUM  TROPONIN I (HIGH SENSITIVITY)                                                                                                                          Radiology DG Chest 2 View Result Date: 05/11/2024 CLINICAL DATA:  Chest pain.  Cardiac arrhythmia. EXAM: CHEST - 2 VIEW COMPARISON:  02/27/2022 FINDINGS: The Davis size and mediastinal contours are within normal limits. Both lungs are clear. The visualized skeletal structures are unremarkable. IMPRESSION: No active cardiopulmonary disease. Electronically Signed   By: Elspeth Bathe M.D.   On: 05/11/2024 10:38    Pertinent labs & imaging results that were available during my care of the patient were reviewed by me and considered in my medical decision making (see MDM for details).  Medications Ordered in ED Medications - No data to display  Procedures Procedures  (including critical care time)  Medical Decision Making / ED Course    Medical Decision Making:    DORIS MCGILVERY is a 28 y.o. male with past medical history as below, significant for PVC who presents to the ED with complaint of palpitations. The complaint involves an extensive differential diagnosis and also carries with it a high risk of complications and morbidity.  Serious etiology was considered. Ddx includes but is not limited to: Arrhythmia, PVC,  PAC, metabolic derangement, thyroid  disturbance, ACS, etc.  Complete initial physical exam performed, notably the patient was in no acute distress.    Reviewed and confirmed nursing documentation for past medical history, family history, social history.  Vital signs reviewed.    Palpitations PVC > - Patient intermittent palpitations over the past week, seems worsening.  He has follow-up with cardiology later this week - He has no chest pain, no dyspnea, no syncope or diaphoresis. - While on telemetry patient did report having some palpitations, occasional PVCs noted on telemetry, occasional PAC. - Troponin negative x 1, he has no chest pain.  Electrolytes are stable, thyroid  testing is normal.  EKG not acute ischemic changes, chest x-ray is unremarkable  Patient's palpitations likely secondary to PVCs or PAC, his burden seems to be low on telemetry.  He has follow-up with cardiology later this week.  Would likely benefit from Zio patch.  Defer further management to cardiology     12:53 PM:  I have discussed the diagnosis/risks/treatment options with the patient and family.  Evaluation and diagnostic testing in the emergency department does not suggest an emergent condition requiring admission or immediate intervention beyond what has been performed at this time.  They will follow up with cardiology/pcp. We also discussed returning to the ED immediately if new or worsening sx occur. We discussed the sx which are most concerning (e.g., sudden worsening pain, fever, inability to tolerate by mouth , cp, dib, syncope) that necessitate immediate return.    The patient appears reasonably screen and/or stabilized for discharge and I doubt any other medical condition or other [facility] requiring further screening, evaluation, or treatment in the ED at this time prior to discharge.                 Additional history obtained: -Additional history obtained from family -External records from  outside source obtained and reviewed including: Chart review including previous notes, labs, imaging, consultation notes including  Prior urgent care documentation, prior labs   Lab Tests: -I ordered, reviewed, and interpreted labs.   The pertinent results include:   Labs Reviewed  BASIC METABOLIC PANEL WITH GFR  CBC  TSH  T4, FREE  MAGNESIUM  TROPONIN I (HIGH SENSITIVITY)    Notable for labs are stable  EKG   EKG Interpretation Date/Time:  Monday May 11 2024 09:55:33 EDT Ventricular Rate:  81 PR Interval:  124 QRS Duration:  95 QT Interval:  370 QTC Calculation: 430 R Axis:   79  Text Interpretation: Sinus rhythm Ventricular premature complex ST elev, probable normal early repol pattern Confirmed by Elnor Jayson ([id number]) on 05/11/2024 10:40:09 AM         Imaging Studies ordered: I ordered imaging studies including CXR I independently visualized the following imaging with scope of interpretation limited to determining acute life threatening conditions related to emergency care; findings noted above I agree with the radiologist interpretation If any imaging was obtained with contrast I closely monitored patient for any possible adverse reaction a/w  contrast administration in the emergency department   Medicines ordered and prescription drug management: No orders of the defined types were placed in this encounter.   -I have reviewed the patients home medicines and have made adjustments as needed   Consultations Obtained: na   Cardiac Monitoring: The patient was maintained on a cardiac monitor.  I personally viewed and interpreted the cardiac monitored which showed an underlying rhythm of: NSR Continuous pulse oximetry interpreted by myself, 100% on RA.    Social Determinants of Health:  Diagnosis or treatment significantly limited by social determinants of health: na   Reevaluation: After the interventions noted above, I reevaluated the patient and found  that they have improved  Co morbidities that complicate the patient evaluation History reviewed. No pertinent past medical history.    Dispostion: Disposition decision including need for hospitalization was considered, and patient discharged from emergency department.    Final Clinical Impression(s) / ED Diagnoses Final diagnoses:  Palpitations  PVC (premature ventricular contraction)        Romond, Pipkins, DO 05/11/24 1253

## 2024-05-11 NOTE — Discharge Instructions (Signed)
 It was a pleasure caring for you today in the emergency department.  Your workup today is reassuring, no evidence of an acute life-threatening emergency.  Be sure to follow-up with cardiology at your scheduled appointment.  In the interim continue to cut back on your caffeine, drink plenty of clear liquids, get plenty of rest.  Please return to the emergency department for any worsening or worrisome symptoms.

## 2024-05-14 NOTE — Progress Notes (Signed)
  Cardiology Office Note   Date:  05/18/2024  ID:  Kirk Davis, DOB 08-10-1996, MRN [id number] PCP: Patient, No Pcp Per  [facility] Providers Cardiologist:  Caron Poser, MD     History of Present Illness Kirk Davis is a 28 y.o. male no relevant PMH who presents for further evaluation and management of PACs.  Patient has been seen twice recently, once on 05/07/2024 and another time at 05/11/2024 for palpitations.  Thyroid  testing, troponin, and other basic labs were reassuring.  ECGs showed PACs with high voltages in the precordial leads.  Patient reports that symptoms have been occurring for a couple of years now.  The palpitations have been particularly bothersome lately.  He works as a IT sales professional.  He has not had any syncopal episodes.  He denies any family history of unexplained, early SCD.  He consumes quite a bit of caffeine as part of his job.  Denies any excessive alcohol use or recreational drug use.  Episodes are sometimes brief and sometimes last for a prolonged period.  Relevant CVD History -None   ROS: Pt denies any chest discomfort, jaw pain, arm pain, syncope, presyncope, orthopnea, PND, or LE edema.  Studies Reviewed I have independently reviewed the patient's ECG, recent medical records, and recent blood work.  Physical Exam VS:  BP 128/82 (BP Location: Left Arm, Patient Position: Sitting, Cuff Size: Normal)   Pulse (!) 58   Ht 5' 9 (1.753 m)   Wt 164 lb 9.6 oz (74.7 kg)   SpO2 97%   BMI 24.31 kg/m        Wt Readings from Last 3 Encounters:  05/18/24 164 lb 9.6 oz (74.7 kg)  05/11/24 160 lb (72.6 kg)  10/10/21 155 lb (70.3 kg)    GEN: No acute distress. NECK: No JVD; No carotid bruits. CARDIAC: RRR, no murmurs, rubs, gallops. RESPIRATORY:  Clear to auscultation. EXTREMITIES:  Warm and well-perfused. No edema.  ASSESSMENT AND PLAN Symptomatic PACs Palpitation LVH by electrocardiogram Patient presents with intermittent episodes of  palpitations have been going on for many years.  No syncope.  No red flag family history.  ECG from one of his ED visits shows PACs, so this is likely symptomatic PACs versus short atrial runs.  ECG shows LVH, but may be a benign variant given his young age.  Plan: - Echocardiogram - Zio monitor - Trigger avoidance - Start low-dose metoprolol XL 25 mg daily if symptoms persist despite trigger avoidance        Dispo: RTC as needed  Signed, Caron Poser, MD

## 2024-05-18 ENCOUNTER — Ambulatory Visit

## 2024-05-18 VITALS — BP 130/82 | HR 58 | Ht 69.0 in | Wt 164.6 lb

## 2024-05-18 DIAGNOSIS — I517 Cardiomegaly: Secondary | ICD-10-CM

## 2024-05-18 DIAGNOSIS — I491 Atrial premature depolarization: Secondary | ICD-10-CM

## 2024-05-18 DIAGNOSIS — R002 Palpitations: Secondary | ICD-10-CM | POA: Diagnosis not present

## 2024-05-18 NOTE — Patient Instructions (Signed)
 Medication Instructions:  Your physician recommends that you continue on your current medications as directed. Please refer to the Current Medication list given to you today.  *If you need a refill on your cardiac medications before your next appointment, please call your pharmacy*  Lab Work: No labs ordered today  If you have labs (blood work) drawn today and your tests are completely normal, you will receive your results only by: MyChart Message (if you have MyChart) OR A paper copy in the mail If you have any lab test that is abnormal or we need to change your treatment, we will call you to review the results.  Testing/Procedures: Your physician has requested that you have an echocardiogram. Echocardiography is a painless test that uses sound waves to create images of your heart. It provides your doctor with information about the size and shape of your heart and how well your heart's chambers and valves are working.   You may receive an ultrasound enhancing agent through an IV if needed to better visualize your heart during the echo. This procedure takes approximately one hour.  There are no restrictions for this procedure.  This will take place at [address] [facility] [address], [place] [address]  Please note: We ask at that you not bring children with you during ultrasound (echo/ vascular) testing. Due to room size and safety concerns, children are not allowed in the ultrasound rooms during exams. Our front office staff cannot provide observation of children in our lobby area while testing is being conducted. An adult accompanying a patient to their appointment will only be allowed in the ultrasound room at the discretion of the ultrasound technician under special circumstances. We apologize for any inconvenience.   ZIO XT- Long Term Monitor Instructions  Your physician has requested you wear a ZIO patch monitor for 14 days.  This is a single patch monitor. [employer]  supplies one patch monitor per enrollment. Additional stickers are not available. Please do not apply patch if you will be having a Nuclear Stress Test, Echocardiogram, Cardiac CT, MRI, or Chest Xray during the period you would be wearing the monitor. The patch cannot be worn during these tests. You cannot remove and re-apply the ZIO XT patch monitor.  Your ZIO patch monitor will be mailed 3 day USPS to your address on file. It may take 3-5 days to receive your monitor after you have been enrolled. Once you have received your monitor, please review the enclosed instructions. Your monitor has already been registered assigning a specific monitor serial number to you.  Billing and Patient Assistance Program Information  We have supplied [employer] with any of your insurance information on file for billing purposes.  [employer] offers a sliding scale Patient Assistance Program for patients that do not have insurance, or whose insurance does not completely cover the cost of the ZIO monitor.  You must apply for the Patient Assistance Program to qualify for this discounted rate.  To apply, please call [employer] at [phone], select option 4, select option 2, ask to apply for Patient Assistance Program. Meredeth will ask your household income, and how many people are in your household. They will quote your out-of-pocket cost based on that information. [employer] will also be able to set up a 65-month, interest-free payment plan if needed.  Applying the monitor   Shave hair from upper left chest.  Hold abrader disc by orange tab. Rub abrader in 40 strokes over the upper left chest as indicated in  your monitor instructions.  Clean area with 4 enclosed alcohol pads. Let dry.  Apply patch as indicated in monitor instructions. Patch will be placed under collarbone on left side of chest with arrow pointing upward.  Rub patch adhesive wings for 2 minutes. Remove white label marked 1. Remove the white label marked 2. Rub  patch adhesive wings for 2 additional minutes.  While looking in a mirror, press and release button in center of patch. A small green light will flash 3-4 times. This will be your only indicator that the monitor has been turned on.  Do not shower for the first 24 hours. You may shower after the first 24 hours.  Press the button if you feel a symptom. You will hear a small click. Record Date, Time and Symptom in the Patient Logbook.  When you are ready to remove the patch, follow instructions on the last 2 pages of Patient Logbook.  Stick patch monitor into the tabs at the bottom of the return box.  Place Patient Logbook in the blue and white box. Use locking tab on box and tape box closed securely. The blue and white box has prepaid postage on it. Please place it in the mailbox as soon as possible. Your physician should have your test results approximately 7-14 days after the monitor has been mailed back to [facility].  Call [facility] Customer Care at [phone] if you have questions regarding your ZIO XT patch monitor.  Call them immediately if you see an orange light blinking on your monitor.  If your monitor falls off in less than 4 days, contact our Monitor department at [phone].  If your monitor becomes loose or falls off after 4 days call [employer] at [phone] for suggestions on securing your monitor.   Follow-Up: At [facility], you and your health needs are our priority.  As part of our continuing mission to provide you with exceptional heart care, our providers are all part of one team.  This team includes your primary Cardiologist (physician) and Advanced Practice Providers or APPs (Physician Assistants and Nurse Practitioners) who all work together to provide you with the care you need, when you need it.  Your next appointment:   AS NEEDED   Provider:   You may see Caron Poser, MD    We recommend signing up for the patient portal called MyChart.   Sign up information is provided on this After Visit Summary.  MyChart is used to connect with patients for Virtual Visits (Telemedicine).  Patients are able to view lab/test results, encounter notes, upcoming appointments, etc.  Non-urgent messages can be sent to your provider as well.   To learn more about what you can do with MyChart, go to [email or web address].

## 2024-06-15 ENCOUNTER — Ambulatory Visit: Payer: Self-pay

## 2024-06-15 DIAGNOSIS — R002 Palpitations: Secondary | ICD-10-CM | POA: Diagnosis not present

## 2024-06-29 ENCOUNTER — Ambulatory Visit

## 2024-08-18 ENCOUNTER — Ambulatory Visit

## 2024-08-18 DIAGNOSIS — I491 Atrial premature depolarization: Secondary | ICD-10-CM

## 2024-08-18 DIAGNOSIS — R002 Palpitations: Secondary | ICD-10-CM

## 2024-08-18 LAB — ECHOCARDIOGRAM COMPLETE
AR max vel: 2.67 cm2
AV Area VTI: 2.42 cm2
AV Area mean vel: 2.57 cm2
AV Mean grad: 5 mmHg
AV Peak grad: 8.5 mmHg
Ao pk vel: 1.46 m/s
Area-P 1/2: 3.27 cm2
S' Lateral: 3.55 cm
